# Patient Record
Sex: Male | Born: 1990 | Race: Black or African American | Hispanic: No | Smoking: Current every day smoker
Health system: Southern US, Community
[De-identification: ages and names within clinical notes are randomized; demographics above are authoritative.]

---

## 2013-08-12 ENCOUNTER — Emergency Department: Payer: Self-pay | Admitting: Emergency Medicine

## 2014-01-17 ENCOUNTER — Emergency Department: Payer: Self-pay | Admitting: Internal Medicine

## 2014-02-06 ENCOUNTER — Emergency Department: Payer: Self-pay | Admitting: Emergency Medicine

## 2014-10-24 DIAGNOSIS — M1990 Unspecified osteoarthritis, unspecified site: Secondary | ICD-10-CM

## 2014-10-24 HISTORY — DX: Unspecified osteoarthritis, unspecified site: M19.90

## 2015-03-27 ENCOUNTER — Emergency Department
Admission: EM | Admit: 2015-03-27 | Discharge: 2015-03-27 | Disposition: A | Discharge status: Home / Self Care | Payer: Self-pay | Attending: Emergency Medicine | Admitting: Emergency Medicine

## 2015-03-27 ENCOUNTER — Emergency Department: Payer: Self-pay

## 2015-03-27 DIAGNOSIS — Z720 Tobacco use: Secondary | ICD-10-CM | POA: Insufficient documentation

## 2015-03-27 DIAGNOSIS — M2141 Flat foot [pes planus] (acquired), right foot: Secondary | ICD-10-CM | POA: Insufficient documentation

## 2015-03-27 DIAGNOSIS — M1990 Unspecified osteoarthritis, unspecified site: Secondary | ICD-10-CM | POA: Insufficient documentation

## 2015-03-27 MED ORDER — IBUPROFEN 800 MG PO TABS
800.0000 mg | ORAL_TABLET | Freq: Once | ORAL | Status: AC
  Administered 2015-03-27: 800 mg via ORAL

## 2015-03-27 MED ORDER — TRAMADOL HCL 50 MG PO TABS: 50.0000 mg | ORAL_TABLET | Freq: Four times a day (QID) | ORAL | Status: DC | PRN

## 2015-03-27 MED ORDER — IBUPROFEN 800 MG PO TABS
ORAL_TABLET | ORAL | Status: AC
  Administered 2015-03-27: 800 mg via ORAL
  Filled 2015-03-27: qty 1

## 2015-03-27 MED ORDER — NAPROXEN 500 MG PO TABS: 500.0000 mg | ORAL_TABLET | Freq: Two times a day (BID) | ORAL | Status: AC

## 2015-03-27 NOTE — ED Notes (Signed)
Ace wrap applied to the right foot without diff, pt tol well, circulation within normal limits

## 2015-03-27 NOTE — ED Notes (Signed)
Pt c/o right foot pain for about a month-2 months.states he thinks it is from wearing steel toed shoes.Marland Kitchen.denies injury

## 2015-03-27 NOTE — Discharge Instructions (Signed)
Arthritis, Nonspecific °Arthritis is pain, redness, warmth, or puffiness (inflammation) of a joint. The joint may be stiff or hurt when you move it. One or more joints may be affected. There are many types of arthritis. Your doctor may not know what type you have right away. The most common cause of arthritis is wear and tear on the joint (osteoarthritis). °HOME CARE  °· Only take medicine as told by your doctor. °· Rest the joint as much as possible. °· Raise (elevate) your joint if it is puffy. °· Use crutches if the painful joint is in your leg. °· Drink enough fluids to keep your pee (urine) clear or pale yellow. °· Follow your doctor's diet instructions. °· Use cold packs for very bad joint pain for 10 to 15 minutes every hour. Ask your doctor if it is okay for you to use hot packs. °· Exercise as told by your doctor. °· Take a warm shower if you have stiffness in the morning. °· Move your sore joints throughout the day. °GET HELP RIGHT AWAY IF:  °· You have a fever. °· You have very bad joint pain, puffiness, or redness. °· You have many joints that are painful and puffy. °· You are not getting better with treatment. °· You have very bad back pain or leg weakness. °· You cannot control when you poop (bowel movement) or pee (urinate). °· You do not feel better in 24 hours or are getting worse. °· You are having side effects from your medicine. °MAKE SURE YOU:  °· Understand these instructions. °· Will watch your condition. °· Will get help right away if you are not doing well or get worse. °Document Released: 01/04/2010 Document Revised: 04/10/2012 Document Reviewed: 01/04/2010 °ExitCare® Patient Information ©2015 [employer]. This information is not intended to replace advice given to you by your health care provider. Make sure you discuss any questions you have with your health care provider. ° °

## 2015-03-27 NOTE — ED Provider Notes (Signed)
CSN: [id number]     Arrival date & time 03/27/15  1457 History   First MD Initiated Contact with Patient 03/27/15 1615     Chief Complaint  Patient presents with  . Foot Pain     (Consider location/radiation/quality/duration/timing/severity/associated sxs/prior Treatment) HPI Patient presents to the department with right foot pain states that he has had swelling off and on his right foot with pain for the last couple of months particular since he started a new job states that he wears steel toed boots all the time denies any trauma has not felt any pops cracks hasn't twisted or dropped anything on his foot states that he has flat feet and that is causing problem in the past rates his pain is worse about a 6 out of 10 nothing seemingly making it to clean better or worse denies any numbness tingling weakness in the extremity any trouble with weightbearing any swelling other than the area across the top of his foot and no other complaints at this time History reviewed. No pertinent past medical history. History reviewed. No pertinent past surgical history. No family history on file. History  Substance Use Topics  . Smoking status: Current Every Day Smoker -- 0.50 packs/day    Types: Cigarettes  . Smokeless tobacco: Never Used  . Alcohol Use: Yes    Review of Systems  Constitutional: Negative.   HENT: Negative.   Eyes: Negative.   Respiratory: Negative.   Cardiovascular: Negative.   Musculoskeletal: Negative.   Skin: Negative.   All other systems reviewed and are negative.      Allergies  Review of patient's allergies indicates no known allergies.  Home Medications   Prior to Admission medications   Medication Sig Start Date End Date Taking? Authorizing Provider  naproxen (NAPROSYN) 500 MG tablet Take 1 tablet (500 mg total) by mouth 2 (two) times daily with a meal. 03/27/15 03/26/16  Katlen Seyer Kristine GarbeWilliam C Zoanne Newill, PA-C  traMADol (ULTRAM) 50 MG tablet Take 1 tablet (50 mg total) by mouth  every 6 (six) hours as needed. 03/27/15   Manolito Jurewicz William C Marielis Samara, PA-C   BP 134/65 mmHg  Pulse 95  Temp(Src) 98.7 F (37.1 C) (Oral)  Resp 18  Ht 6\' 6"  (1.981 m)  Wt 350 lb (158.759 kg)  BMI 40.45 kg/m2  SpO2 96% Physical Exam Male appearing stated age well-developed well-nourished no acute distress Vitals nursing notes reviewed Head ears eyes nose neck and throat examination this patient was unremarkable Cardiovascular regular rate and rhythm no murmurs rubs gallops good distal pulses and cap refill Pulmonary lungs clear to auscultation bilaterally Musculoskeletal patient has noticeably flatfeet mild swelling the anterior ankle no palpable deformity or abnormality negative Homans sign Neuro exam nonfocal good distal sensation Skin appears free of rash or disease ED Course  Procedures (Ace wrap was applied to right foot Labs Review Labs Reviewed - No data to display  Imaging Review Dg Foot Complete Right  03/27/2015   CLINICAL DATA:  Right hindfoot and heel pain. Pes planus. No known injury.  EXAM: RIGHT FOOT COMPLETE - 3+ VIEW  COMPARISON:  None.  FINDINGS: There is no evidence of fracture or dislocation.  Mild degenerative spurring is seen involving the first MTP joint. Pes planus is noted, with mild to moderate osteoarthritis involving the talonavicular joint.  IMPRESSION:  No acute findings.  Pes planus and talonavicular osteoarthritis.  Mild degenerative spurring of first MTP joint.   Electronically Signed   By: Alver SorrowJohn  Stahl M.D.  On: 03/27/2015 16:55     EKG Interpretation None      MDM  Decision making on this patient she has had off and on foot pain for greater than the last 2 months worse when standing on it all day at work states that he wears flat steel toed boots he ordered he has flat feet denies any trauma or any other injury advised him that he should get some foot support follow-up with a podiatrist and start taking anti-inflammatory otherwise return here for any acute  concerns or worsening symptoms was given information for the podiatrist on call Final diagnoses:  Arthritis  Pes planus of right foot       Elainah Rhyne Rosalyn Gess, PA-C 03/27/15 1749  Minna Antis, MD 03/27/15 2318

## 2017-09-20 LAB — HM HIV SCREENING LAB: HM HIV Screening: NEGATIVE

## 2019-05-28 ENCOUNTER — Ambulatory Visit: Payer: Self-pay

## 2019-06-03 ENCOUNTER — Ambulatory Visit: Payer: Self-pay | Admitting: Physician Assistant

## 2019-06-03 ENCOUNTER — Other Ambulatory Visit: Payer: Self-pay

## 2019-06-03 ENCOUNTER — Encounter: Payer: Self-pay | Admitting: Physician Assistant

## 2019-06-03 DIAGNOSIS — Z113 Encounter for screening for infections with a predominantly sexual mode of transmission: Secondary | ICD-10-CM

## 2019-06-03 DIAGNOSIS — Z202 Contact with and (suspected) exposure to infections with a predominantly sexual mode of transmission: Secondary | ICD-10-CM

## 2019-06-03 LAB — GRAM STAIN

## 2019-06-03 MED ORDER — METRONIDAZOLE 500 MG PO TABS: 500.0000 mg | ORAL_TABLET | Freq: Once | ORAL | 0 refills | Status: AC

## 2019-06-03 NOTE — Progress Notes (Signed)
    STI clinic/screening visit  Subjective:  Frederick Delgado is a 28 y.o. male being seen today for an STI screening visit. The patient reports they do have symptoms.  Patient has the following medical conditions:  There are no active problems to display for this patient.    Chief Complaint  Patient presents with  . SEXUALLY TRANSMITTED DISEASE    HPI  Patient reports that he has had whitish discharge from his penis for about 1 week and that he is a contact to [place].  Denies other symptoms.  See flowsheet for further details and programmatic requirements.    The following portions of the patient's history were reviewed and updated as appropriate: allergies, current medications, past medical history, past social history, past surgical history and problem list.  Objective:  There were no vitals filed for this visit.  Physical Exam Constitutional:      General: He is not in acute distress.    Appearance: Normal appearance.  HENT:     Head: Normocephalic and atraumatic.     Mouth/Throat:     Mouth: Mucous membranes are moist.     Pharynx: Oropharynx is clear. No oropharyngeal exudate or posterior oropharyngeal erythema.  Neck:     Musculoskeletal: Neck supple.  Pulmonary:     Effort: Pulmonary effort is normal.  Abdominal:     Palpations: Abdomen is soft. There is no mass.     Tenderness: There is no abdominal tenderness. There is no guarding or rebound.  Genitourinary:    Penis: Normal.      Scrotum/Testes: Normal.     Comments: Pubic area without nits, lice, edema, erythema, lesions and inguinal adenopathy. Penis circumcised and without discharge at meatus. Lymphadenopathy:     Cervical: No cervical adenopathy.  Skin:    General: Skin is warm and dry.     Findings: No bruising, erythema, lesion or rash.  Neurological:     Mental Status: He is alert and oriented to person, place, and time.  Psychiatric:        Mood and Affect: Mood normal.        Behavior:  Behavior normal.        Thought Content: Thought content normal.        Judgment: Judgment normal.       Assessment and Plan:  Frederick Delgado is a 28 y.o. male presenting to the [facility] Department for STI screening  1. Screening for STD (sexually transmitted disease) Patient having symptoms and is a contact to [place]. Rec condoms with all sex Await test results.  Counseled that RN will call if needs to RTC for further treatment once results are back. - Gram stain - Gonococcus culture - HIV [facility] - Syphilis Serology, [facility] - Gonococcus culture  2. Venereal disease contact Will treat as a contact to [place] with Metronidazole 2 g po at one time with food, no EtOH for 24 hr before and until 72 hr after completing medicine. No sex for 7 days and until after partner completes treatment Rec RTC if vomits <2 hr after taking medicine.  - Gonococcus culture - HIV [facility] - Syphilis Serology, [facility] - Gonococcus culture - metroNIDAZOLE (FLAGYL) 500 MG tablet;  Take 4 tablets at one time by mouth for one dose.  Dispense: 4 tablet; Refill: 0     No follow-ups on file.  No future appointments.  Jerene Dilling, PA

## 2019-06-08 LAB — GONOCOCCUS CULTURE

## 2020-06-12 ENCOUNTER — Other Ambulatory Visit: Payer: Self-pay

## 2020-06-12 DIAGNOSIS — Z20822 Contact with and (suspected) exposure to covid-19: Secondary | ICD-10-CM

## 2020-06-13 LAB — NOVEL CORONAVIRUS, NAA: SARS-CoV-2, NAA: DETECTED — AB

## 2020-06-13 LAB — SARS-COV-2, NAA 2 DAY TAT

## 2020-06-14 ENCOUNTER — Telehealth: Payer: Self-pay | Admitting: Nurse Practitioner

## 2020-06-14 NOTE — Telephone Encounter (Signed)
Called to discuss with Frederick Delgado about Covid symptoms and the use of casirivimab/imdevimab, a combination monoclonal antibody infusion for those with mild to moderate Covid symptoms and at a high risk of hospitalization.     Pt does not qualify for infusion therapy as he  has asymptomatic infection. Isolation precautions discussed. Advised to contact back for consideration should he develop symptoms. Patient verbalized understanding.     Frederick Delgado, AGPCNP-BC

## 2021-11-26 ENCOUNTER — Other Ambulatory Visit: Payer: Self-pay

## 2021-11-26 ENCOUNTER — Encounter: Payer: Self-pay | Admitting: Family Medicine

## 2021-11-26 ENCOUNTER — Ambulatory Visit (INDEPENDENT_AMBULATORY_CARE_PROVIDER_SITE_OTHER): Payer: BC Managed Care – PPO | Admitting: Family Medicine

## 2021-11-26 ENCOUNTER — Telehealth: Payer: Self-pay | Admitting: Family Medicine

## 2021-11-26 VITALS — BP 132/78 | HR 61 | Ht 77.0 in | Wt 391.0 lb

## 2021-11-26 DIAGNOSIS — M7661 Achilles tendinitis, right leg: Secondary | ICD-10-CM

## 2021-11-26 DIAGNOSIS — M2141 Flat foot [pes planus] (acquired), right foot: Secondary | ICD-10-CM | POA: Diagnosis not present

## 2021-11-26 DIAGNOSIS — M19071 Primary osteoarthritis, right ankle and foot: Secondary | ICD-10-CM

## 2021-11-26 DIAGNOSIS — M2140 Flat foot [pes planus] (acquired), unspecified foot: Secondary | ICD-10-CM | POA: Insufficient documentation

## 2021-11-26 DIAGNOSIS — M76821 Posterior tibial tendinitis, right leg: Secondary | ICD-10-CM

## 2021-11-26 DIAGNOSIS — M2142 Flat foot [pes planus] (acquired), left foot: Secondary | ICD-10-CM

## 2021-11-26 MED ORDER — DICLOFENAC SODIUM 75 MG PO TBEC: 75.0000 mg | DELAYED_RELEASE_TABLET | Freq: Two times a day (BID) | ORAL | 0 refills | Status: DC | PRN

## 2021-11-26 NOTE — Assessment & Plan Note (Signed)
31 year old gentleman presents for evaluation of chronic right foot and ankle pain, atraumatic in onset, aggravated for years with prolonged weightbearing, tight fitting shoes, and has begun to interfere with his occupation (works at [employer]).  Pain is somewhat diffuse in nature about what he describes as the midfoot and hindfoot, no radiation proximally or distally, associated with swelling, stiffness after period of immobility, and describes weakness, no report of paresthesias.  Treatment today included OTC medications with sporadic benefit, record review reveals prior visit to ER in 2016 during which naproxen and tramadol written for, patient is uncertain about benefit.  Examination reveals severe pes planus bilaterally, generalized swelling about the hindfoot, he has limited range of motion in all directions when compared to contralateral, tenderness about the medial aspect of the insertional Achilles, medial midfoot arch, and along the posterior tibialis tendons.  Patient has reduced capacity to perform single-leg heel raise on right, perform this without issue on the left.  Given patient's x-ray findings showing focal areas of degenerative changes that are consistent with his severe pes planus, pain localizing to the medial midfoot arch, posterior tibialis tendon, and Achilles, I have reviewed both surgical and nonsurgical treatment strategies.  He is wishing to proceed in a nonsurgical manner at this stage, as such I have advised the benefit of custom molded orthotics, initial home-based rehab with [facility] foot/ankle conditioning program materials provided today, and course of diclofenac until his return.  I ordered new x-rays to assess interval degeneration from prior studies available from 2016, this can serve as preprocedural planning if local injections are indicated for focal recalcitrant pain at his return.  Chronic issue, uncontrolled, radiology review, Rx management

## 2021-11-26 NOTE — Telephone Encounter (Signed)
Please advise for podiatry referral.

## 2021-11-26 NOTE — Progress Notes (Signed)
Primary Care / Sports Medicine Office Visit  Patient Information:  Patient ID: Frederick Delgado, male DOB: 1991-04-20 Age: 31 y.o. MRN: [id number]   Frederick Delgado is a pleasant 31 y.o. male presenting with the following:  Chief Complaint  Patient presents with   New Patient (Initial Visit)   Establish Care   Foot Pain    Right heel and lateral side; x10 years; no imaging or evaluation for it since 2016; treatments include stretching, ibuprofen, Tylenol, naproxen, Tramadol, heat, ice with no relief; 10/10 pain    Vitals:   11/26/21 1033  BP: 132/78  Pulse: 61  SpO2: 98%   Vitals:   11/26/21 1033  Weight: (!) 391 lb (177.4 kg)  Height: 6\' 5"  (1.956 m)   Body mass index is 46.37 kg/m.  No results found.   Independent interpretation of notes and tests performed by another provider:   None  Procedures performed:   Independent interpretation of right foot and ankle x-rays from 2016 reveal severe pes planus, focal degenerative changes at the talar malleolar junction, medial greater than lateral, joint space loss and osteophyte formation at the talonavicular junction best appreciated on lateral view, first MTP degenerative changes.  Pertinent History, Exam, Impression, and Recommendations:   Flat foot 31 year old gentleman presents for evaluation of chronic right foot and ankle pain, atraumatic in onset, aggravated for years with prolonged weightbearing, tight fitting shoes, and has begun to interfere with his occupation (works at [employer]).  Pain is somewhat diffuse in nature about what he describes as the midfoot and hindfoot, no radiation proximally or distally, associated with swelling, stiffness after period of immobility, and describes weakness, no report of paresthesias.  Treatment today included OTC medications with sporadic benefit, record review reveals prior visit to ER in 2016 during which naproxen and tramadol written for, patient is uncertain about  benefit.  Examination reveals severe pes planus bilaterally, generalized swelling about the hindfoot, he has limited range of motion in all directions when compared to contralateral, tenderness about the medial aspect of the insertional Achilles, medial midfoot arch, and along the posterior tibialis tendons.  Patient has reduced capacity to perform single-leg heel raise on right, perform this without issue on the left.  Given patient's x-ray findings showing focal areas of degenerative changes that are consistent with his severe pes planus, pain localizing to the medial midfoot arch, posterior tibialis tendon, and Achilles, I have reviewed both surgical and nonsurgical treatment strategies.  He is wishing to proceed in a nonsurgical manner at this stage, as such I have advised the benefit of custom molded orthotics, initial home-based rehab with [facility] foot/ankle conditioning program materials provided today, and course of diclofenac until his return.  I ordered new x-rays to assess interval degeneration from prior studies available from 2016, this can serve as preprocedural planning if local injections are indicated for focal recalcitrant pain at his return.  Chronic issue, uncontrolled, radiology review, Rx management  Posterior tibial tendinitis, right See additional assessment(s) for plan details.  Achilles tendinitis, right leg See additional assessment(s) for plan details.  Localized osteoarthritis of right ankle See additional assessment(s) for plan details.   Orders & Medications Meds ordered this encounter  Medications   diclofenac (VOLTAREN) 75 MG EC tablet    Sig: Take 1 tablet (75 mg total) by mouth 2 (two) times daily as needed (foot/ankle pain).    Dispense:  60 tablet    Refill:  0   Orders Placed This Encounter  Procedures   DG Foot Complete Right   DG Ankle Complete Right   Ambulatory referral to Sports Medicine     Return in about 6 weeks (around 01/07/2022).      Jerrol Banana, MD   Primary Care Sports Medicine [facility]

## 2021-11-26 NOTE — Patient Instructions (Signed)
-   Start diclofenac twice daily x2 weeks (take with food) - After 2 weeks dose up to twice daily on an as-needed basis for foot/ankle pain - Start home exercises after 1 week - Referral coordinator will contact you to schedule custom orthotic inserts - Return for follow-up in 6 weeks, contact for questions between now and then

## 2021-11-26 NOTE — Assessment & Plan Note (Signed)
See additional assessment(s) for plan details. 

## 2021-11-26 NOTE — Telephone Encounter (Signed)
Pts is calling to request a referral to be placed to the foot center in [place] for a second opinion. Please advise when the referral is placed CB- [phone]

## 2021-11-26 NOTE — Telephone Encounter (Signed)
Podiatry referral placed.  Left message notifying patient.

## 2021-12-01 ENCOUNTER — Ambulatory Visit: Payer: BC Managed Care – PPO | Admitting: Family Medicine

## 2021-12-10 ENCOUNTER — Other Ambulatory Visit: Payer: Self-pay

## 2021-12-10 ENCOUNTER — Ambulatory Visit (INDEPENDENT_AMBULATORY_CARE_PROVIDER_SITE_OTHER): Payer: BC Managed Care – PPO

## 2021-12-10 ENCOUNTER — Encounter: Payer: Self-pay | Admitting: Podiatry

## 2021-12-10 ENCOUNTER — Ambulatory Visit: Payer: BC Managed Care – PPO | Admitting: Podiatry

## 2021-12-10 DIAGNOSIS — M2141 Flat foot [pes planus] (acquired), right foot: Secondary | ICD-10-CM

## 2021-12-10 DIAGNOSIS — M19071 Primary osteoarthritis, right ankle and foot: Secondary | ICD-10-CM

## 2021-12-10 DIAGNOSIS — M2140 Flat foot [pes planus] (acquired), unspecified foot: Secondary | ICD-10-CM

## 2021-12-13 ENCOUNTER — Telehealth: Payer: Self-pay | Admitting: Podiatry

## 2021-12-13 NOTE — Telephone Encounter (Signed)
Frederick Delgado has a return to work date of 01/10/2022, is that ok?

## 2021-12-14 ENCOUNTER — Telehealth: Payer: Self-pay | Admitting: Family Medicine

## 2021-12-14 NOTE — Telephone Encounter (Signed)
Pt called in for assistance. Pt says that he is working with Loletta Parish and need the office notes to his visits. Pt would like to have them faxed to    [phone] Claims # [id number]  Please assist pt further.

## 2021-12-15 NOTE — Telephone Encounter (Signed)
Office visit notes faxed as requested.  Patient is following with podiatry for his medical condition.

## 2021-12-16 ENCOUNTER — Telehealth: Payer: Self-pay | Admitting: Podiatry

## 2021-12-16 NOTE — Telephone Encounter (Signed)
Good Morning, Frederick Delgado is calling again because he needs his notes for his job. I told patient I will give him a call as soon as they are dictated. "Frederick Delgado stated, "if you dont mind me asking what is the hold up on him doing my notes". I told him we are going through a transition with another Physician that is no longer with the Practice, and that you had to absorb some of those pts. But, I told Frederick Delgado I would definitely call him back once I see them in the system.Marland Kitchen

## 2021-12-17 NOTE — Telephone Encounter (Signed)
Note dictated. He will likely be out of work longer than 01/10/22, but I guess we can extend it when he comes back into the office in a month. Thanks Marylene Land!!! - Dr. Bea Laura

## 2021-12-17 NOTE — Progress Notes (Signed)
Subjective:  31 y.o. male presenting today as a new patient to the practice.  Patient was last seen in 2016 for the same condition of right foot pain when x-rays were taken.  Patient states that for several years now he has had pain and tenderness associated to his right foot.  This is been an ongoing issue for over 10 years now.  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  He states that for several years he feels as if he is walking on a constant bruise.  He experiences right foot pain all over the foot.  Worse in the mornings or when standing.  He says he has very flat feet.  He denies a history of injury.  He has tried resting his foot, stretching, ice, heat, and foot soaks, and orthotics with no improvement.  He presents for further treatment and evaluation  Past Medical History:  Diagnosis Date   Arthritis 2016   right foot   No past surgical history on file. No Known Allergies     Objective/Physical Exam General: The patient is alert and oriented x3 in no acute distress.  Dermatology: Skin is warm, dry and supple bilateral lower extremities. Negative for open lesions or macerations.  Vascular: Palpable pedal pulses bilaterally. No edema or erythema noted. Capillary refill within normal limits.  Neurological: Epicritic and protective threshold grossly intact bilaterally.   Musculoskeletal Exam: Range of motion within normal limits to all pedal and ankle joints bilateral. Muscle strength 5/5 in all groups bilateral.  Upon weightbearing there is a medial longitudinal arch collapse bilaterally. Remove foot valgus noted to the bilateral lower extremities with excessive pronation upon mid stance.  Radiographic Exam:  Normal osseous mineralization.  Advanced, severe degenerative changes noted throughout the midtarsal joint especially the TN, and subtalar joint.  Periarticular spurring and dorsal beaking noted on lateral view. Pes planus noted on radiographic  exam lateral views. Decreased calcaneal inclination and metatarsal declination angle is noted.  Assessment: 1. pes planus bilateral 2.  Advanced severe DJD right midfoot/rearfoot  Plan of Care:  1. Patient was evaluated. X-Rays reviewed.  2.  Today we discussed the patient's condition including advanced arthritic changes throughout the midfoot.  We discussed both conservative and surgical management.  The patient is very frustrated with the foot and is at the point where he would like to pursue aggressive surgical management.  Triple arthrodesis surgery was discussed in detail with the patient including the postoperative recovery.  No guarantees were expressed or implied.  Although the patient is ready to proceed with surgery, I do recommend that he goes home and talks with his family and friends about the surgery and postoperative recovery course. 3.  Also recommend MRI right foot.  Order placed 4.  Unfortunately the patient has been out of work for several months associated to his foot condition.  When he has surgery he will likely be out for an additional several months for postoperative recovery.  I do believe after discussion with the patient that surgery is warranted at this point due to the advanced degenerative changes and failure of conservative treatment to provide any sort of lasting alleviation of symptoms for the patient.   5.  Return to clinic in 1 month to review MRI scan results and for surgical consult.   Felecia Shelling, DPM [facility]  Dr. Felecia Shelling, DPM    [address]  [place], [place] [address]                Office [phone]  Fax [phone]

## 2021-12-27 ENCOUNTER — Ambulatory Visit
Admission: RE | Admit: 2021-12-27 | Discharge: 2021-12-27 | Disposition: A | Discharge status: Home / Self Care | Payer: BC Managed Care – PPO | Source: Ambulatory Visit | Attending: Podiatry | Admitting: Podiatry

## 2021-12-27 DIAGNOSIS — M19071 Primary osteoarthritis, right ankle and foot: Secondary | ICD-10-CM

## 2021-12-27 DIAGNOSIS — M2140 Flat foot [pes planus] (acquired), unspecified foot: Secondary | ICD-10-CM

## 2022-01-07 ENCOUNTER — Ambulatory Visit: Payer: Self-pay | Admitting: Family Medicine

## 2022-01-11 ENCOUNTER — Ambulatory Visit: Payer: BC Managed Care – PPO | Admitting: Podiatry

## 2022-01-11 ENCOUNTER — Other Ambulatory Visit: Payer: Self-pay

## 2022-01-11 DIAGNOSIS — M19071 Primary osteoarthritis, right ankle and foot: Secondary | ICD-10-CM

## 2022-01-11 DIAGNOSIS — M2140 Flat foot [pes planus] (acquired), unspecified foot: Secondary | ICD-10-CM | POA: Diagnosis not present

## 2022-01-11 NOTE — Progress Notes (Signed)
? ?  Subjective:  ?31 y.o. male presenting today as a new patient to the practice.  Patient was last seen in 2016 for the same condition of right foot pain when x-rays were taken.  Patient states that for several years now he has had pain and tenderness associated to his right foot.  This is been an ongoing issue for over 10 years now.  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  He states that for several years he feels as if he is walking on a constant bruise.  He experiences right foot pain all over the foot.  Worse in the mornings or when standing.  He says he has very flat feet.  He denies a history of injury.  He has tried resting his foot, stretching, ice, heat, and foot soaks, and orthotics with no improvement.  He presents for further treatment and evaluation ? ?Past Medical History:  ?Diagnosis Date  ? Arthritis 2016  ? right foot  ? ?No past surgical history on file. ?No Known Allergies ? ? ?  ?Objective/Physical Exam ?General: The patient is alert and oriented x3 in no acute distress. ? ?Dermatology: Skin is warm, dry and supple bilateral lower extremities. Negative for open lesions or macerations. ? ?Vascular: Palpable pedal pulses bilaterally. No edema or erythema noted. Capillary refill within normal limits. ? ?Neurological: Epicritic and protective threshold grossly intact bilaterally.  ? ?Musculoskeletal Exam: Range of motion within normal limits to all pedal and ankle joints bilateral. Muscle strength 5/5 in all groups bilateral.  ?Upon weightbearing there is a medial longitudinal arch collapse bilaterally. Remove foot valgus noted to the bilateral lower extremities with excessive pronation upon mid stance. ? ?Radiographic Exam:  ?Normal osseous mineralization.  Advanced, severe degenerative changes noted throughout the midtarsal joint especially the TN, and subtalar joint.  Periarticular spurring and dorsal beaking noted on lateral view. ?Pes planus noted on radiographic  exam lateral views. Decreased calcaneal inclination and metatarsal declination angle is noted. ? ?Assessment: ?1. pes planus bilateral ?2.  Advanced severe DJD right midfoot/rearfoot ? ?Plan of Care:  ?1. Patient was evaluated.  The MRI performed to the right foot was reviewed today and unfortunately only shows the forefoot.  We will need to contact [facility] imaging and possibly need to redo the MRI to cover the rear foot ?2.  Also the patient states that he has not tried custom orthotics before in the past.  Today were going to set up an appointment with the Pedorthist for custom molded orthotics to see if this helps alleviate his pain prior to surgery ?3.  Return to clinic after 4-6 weeks of wearing the orthotics to see if this helps alleviate some of his pain or symptoms ? ? ?Edrick Kins, DPM ?[place] ? ?Dr. Edrick Kins, DPM  ?  ?[address]                                        ?[place], [place] [address]                ?Office [phone]  ?Fax [phone] ? ? ? ? ?

## 2022-01-12 ENCOUNTER — Telehealth: Payer: Self-pay

## 2022-01-12 NOTE — Telephone Encounter (Signed)
-----   Message from Everitt Amber sent at 01/12/2022 12:20 PM EDT ----- ?Regarding: FW: Needs visit ?Please schedule for [facility]/ PCP Annual physical appt ?----- Message ----- ?From: Jerrol Banana, MD ?Sent: 01/11/2022   6:09 PM EDT ?To: [facility] ?Subject: Needs visit                                   ? ?Patient needs primary care visit for annual physical scheduled ? ? ?

## 2022-01-14 ENCOUNTER — Other Ambulatory Visit: Payer: BC Managed Care – PPO

## 2022-01-17 ENCOUNTER — Encounter: Payer: BC Managed Care – PPO | Admitting: Family Medicine

## 2022-01-25 ENCOUNTER — Encounter: Payer: Self-pay | Admitting: Family Medicine

## 2022-01-25 ENCOUNTER — Ambulatory Visit (INDEPENDENT_AMBULATORY_CARE_PROVIDER_SITE_OTHER): Payer: BC Managed Care – PPO | Admitting: Family Medicine

## 2022-01-25 VITALS — BP 122/78 | HR 78 | Ht 77.0 in | Wt >= 6400 oz

## 2022-01-25 DIAGNOSIS — Z13220 Encounter for screening for lipoid disorders: Secondary | ICD-10-CM | POA: Diagnosis not present

## 2022-01-25 DIAGNOSIS — M2141 Flat foot [pes planus] (acquired), right foot: Secondary | ICD-10-CM

## 2022-01-25 DIAGNOSIS — Z1159 Encounter for screening for other viral diseases: Secondary | ICD-10-CM | POA: Diagnosis not present

## 2022-01-25 DIAGNOSIS — Z6841 Body Mass Index (BMI) 40.0 and over, adult: Secondary | ICD-10-CM

## 2022-01-25 DIAGNOSIS — Z0000 Encounter for general adult medical examination without abnormal findings: Secondary | ICD-10-CM | POA: Diagnosis not present

## 2022-01-25 DIAGNOSIS — R7989 Other specified abnormal findings of blood chemistry: Secondary | ICD-10-CM

## 2022-01-25 DIAGNOSIS — Z114 Encounter for screening for human immunodeficiency virus [HIV]: Secondary | ICD-10-CM

## 2022-01-25 DIAGNOSIS — M2142 Flat foot [pes planus] (acquired), left foot: Secondary | ICD-10-CM

## 2022-01-25 NOTE — Assessment & Plan Note (Signed)
Annual examination completed, risk stratification labs ordered, anticipatory guidance provided.  We will follow labs once resulted. 

## 2022-01-25 NOTE — Assessment & Plan Note (Signed)
We reviewed lifestyle modification methods, generating a net caloric deficit, and surgical options given BMI.  Information provided today in that regard, risk stratification labs ordered, he can contact us if wishing to proceed with bariatric surgery. ?

## 2022-01-25 NOTE — Progress Notes (Signed)
?  ? ?Annual Physical Exam Visit ? ?Patient Information:  ?Patient ID: Frederick Delgado, male DOB: [birth year] Age: 31 y.o. MRN: [id number]  ? ?Subjective:  ? ?CC: Annual Physical Exam ? ?HPI:  ?Frederick Delgado is here for their annual physical. ? ?I reviewed the past medical history, family history, social history, surgical history, and allergies today and changes were made as necessary.  Please see the problem list section below for additional details. ? ?Past Medical History: ?Past Medical History:  ?Diagnosis Date  ? Arthritis 2016  ? right foot  ? ?Past Surgical History: ?History reviewed. No pertinent surgical history. ?Family History: ?Family History  ?Problem Relation Age of Onset  ? Diabetes Mother   ? Stroke Father   ? Heart attack Father   ? Diabetes Brother   ? Stroke Maternal Grandmother   ? Cancer Maternal Grandfather   ? Cancer Paternal Grandfather   ? ?Allergies: ?No Known Allergies ?Health Maintenance: ?Health Maintenance  ?Topic Date Due  ? Hepatitis C Screening  Never done  ? TETANUS/TDAP  01/26/2023 (Originally 03/22/2008)  ? INFLUENZA VACCINE  05/24/2022  ? HIV Screening  Completed  ? HPV VACCINES  Aged Out  ? COVID-19 Vaccine  Discontinued  ?  ?HM Colonoscopy   ? ? This patient has no relevant Health Maintenance data.  ? ?  ? ?Medications: ?No current outpatient medications on file prior to visit.  ? ?No current facility-administered medications on file prior to visit.  ? ? ?Review of Systems: No headache, visual changes, nausea, vomiting, diarrhea, constipation, dizziness, abdominal pain, skin rash, fevers, chills, night sweats, swollen lymph nodes, weight loss, chest pain, body aches, joint swelling, muscle aches, shortness of breath, mood changes, visual or auditory hallucinations reported. ? ?Objective:  ? ?Vitals:  ? 01/25/22 1109  ?BP: 122/78  ?Pulse: 78  ?SpO2: 98%  ? ?Vitals:  ? 01/25/22 1109  ?Weight: (!) 404 lb (183.3 kg)  ?Height: 6\' 5"  (1.956 m)  ? ?Body mass index is 47.91  kg/m?. ? ?General: Well Developed, obese, well nourished, and in no acute distress.  ?Neuro: Alert and oriented x3, extra-ocular muscles intact, sensation grossly intact. Cranial nerves II through XII are grossly intact, motor, sensory, and coordinative functions are intact. ?HEENT: Normocephalic, atraumatic, pupils equal round reactive to light, neck supple, no masses, no lymphadenopathy, thyroid nonpalpable. Oropharynx, nasopharynx, external ear canals are unremarkable. ?Skin: Warm and dry, no rashes noted.  ?Cardiac: Regular rate and rhythm, no murmurs rubs or gallops. No peripheral edema. Pulses symmetric. ?Respiratory: Clear to auscultation bilaterally. Not using accessory muscles, speaking in full sentences.  ?Abdominal: Soft, nontender, nondistended, positive bowel sounds, no masses, no organomegaly. ?Musculoskeletal: Shoulder, elbow, wrist, hip, knee, ankle stable, and with full range of motion. ? ?Impression and Recommendations:  ? ?The patient was counselled, risk factors were discussed, and anticipatory guidance given. ? ?Flat foot ?Patient has established with podiatry who is reviewing surgical options with the patient, work-up ongoing.  We will follow peripherally on this issue. ? ?Morbid obesity with BMI of 45.0-49.9, adult ([place]) ?We reviewed lifestyle modification methods, generating a net caloric deficit, and surgical options given BMI.  Information provided today in that regard, risk stratification labs ordered, he can contact us if wishing to proceed with bariatric surgery. ? ?Annual physical exam ?Annual examination completed, risk stratification labs ordered, anticipatory guidance provided.  We will follow labs once resulted. ? ?Orders & Medications ?Medications: No orders of the defined types were placed in this encounter. ? ?Orders  Placed This Encounter  ?Procedures  ? Apo A1 + B + Ratio  ? CBC  ? Comprehensive metabolic panel  ? Hepatitis C antibody  ? VITAMIN D 25 Hydroxy (Vit-D Deficiency,  Fractures)  ? TSH  ? Lipid panel  ? HIV Antibody (routine testing w rflx)  ?  ? ?Return in about 1 year (around 01/26/2023) for Annual Physical.  ? ? ?Montel Culver, MD ? ? Primary Care Sports Medicine ?[facility] ?[place]  ? ?

## 2022-01-25 NOTE — Patient Instructions (Signed)
-   Obtain fasting labs with orders provided (can have water or black coffee but otherwise no food or drink x 8 hours before labs) °- Review information provided °- Attend eye doctor annually, dentist every 6 months, work towards or maintain 30 minutes of moderate intensity physical activity at least 5 days per week, and consume a balanced diet °- Return in 1 year for physical °- Contact us for any questions between now and then °

## 2022-01-25 NOTE — Assessment & Plan Note (Signed)
Patient has established with podiatry who is reviewing surgical options with the patient, work-up ongoing.  We will follow peripherally on this issue. ?

## 2022-02-07 ENCOUNTER — Telehealth: Payer: Self-pay | Admitting: Podiatry

## 2022-02-07 NOTE — Telephone Encounter (Signed)
Frederick Delgado was wondering if anyone had reached out to [facility] about the MRI that was done. He said that you were suppose to have it set up again because It didn't show the part of his foot that you wanted. He also didn't want to be charged 2 times for the MRI. Please contact patient with update. ?

## 2022-02-11 NOTE — Telephone Encounter (Signed)
Repeat MRI scheduled for 02/15/22. Will contact the patient after MRI to review results and discuss further surgical planning.  Thanks, Dr. Amalia Hailey

## 2022-02-15 ENCOUNTER — Ambulatory Visit: Payer: BC Managed Care – PPO | Admitting: Podiatry

## 2022-02-18 ENCOUNTER — Other Ambulatory Visit: Payer: Self-pay | Admitting: Podiatry

## 2022-02-18 ENCOUNTER — Ambulatory Visit
Admission: RE | Admit: 2022-02-18 | Discharge: 2022-02-18 | Disposition: A | Discharge status: Home / Self Care | Payer: BC Managed Care – PPO | Source: Ambulatory Visit | Attending: Podiatry | Admitting: Podiatry

## 2022-02-18 ENCOUNTER — Telehealth: Payer: Self-pay | Admitting: Podiatry

## 2022-02-18 DIAGNOSIS — M19071 Primary osteoarthritis, right ankle and foot: Secondary | ICD-10-CM

## 2022-02-18 NOTE — Telephone Encounter (Signed)
Sharl Ma with [facility] called and stated that they had an order for the foot and not the heel. They brought the pt back in for additional images to include the heel. The radiologist will not read it unless there is an order for the ankle. Please enter order for MR Right ankle in system and they will proceed as a no charge and have them read. Sharl Ma direct number [phone] ?

## 2022-02-28 NOTE — Progress Notes (Signed)
Could we please call this patient and set up a follow-up appointment next week or 2?  Thanks, Dr. Logan Bores

## 2022-02-28 NOTE — Progress Notes (Signed)
Unless the patient has not received his custom orthotics yet.  I really want to see him about 4 weeks after he has been wearing the custom orthotics.  Thanks, Dr. Logan Bores

## 2022-03-01 ENCOUNTER — Ambulatory Visit: Payer: BC Managed Care – PPO | Admitting: Podiatry

## 2022-03-01 ENCOUNTER — Encounter: Payer: Self-pay | Admitting: Podiatry

## 2022-03-01 DIAGNOSIS — M21861 Other specified acquired deformities of right lower leg: Secondary | ICD-10-CM

## 2022-03-01 DIAGNOSIS — M2140 Flat foot [pes planus] (acquired), unspecified foot: Secondary | ICD-10-CM

## 2022-03-01 NOTE — Progress Notes (Signed)
? ?Subjective:  ?31 y.o. 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  Patient recently had updated ankle MRI on 02/10/2022 and he presents today to review the results and discuss surgery.   ? ?Patient was initially evaluated 2016 for the same condition of right foot pain when x-rays were taken.  Patient states that for several years now he has had pain and tenderness associated to his right foot.  This is been an ongoing issue for over 10+ years now.  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  He states that for several years he feels as if he is walking on a constant bruise.  He experiences right foot pain all over the foot.  Worse in the mornings or when standing.  He says he has very flat feet.  He denies a history of injury.  He has tried resting his foot, stretching, ice, heat, and foot soaks, and orthotics with no improvement.  He presents for further treatment and evaluation ? ?Past Medical History:  ?Diagnosis Date  ? Arthritis 2016  ? right foot  ? ?No past surgical history on file. ?No Known Allergies ? ? ?  ?Objective/Physical Exam ?General: The patient is alert and oriented x3 in no acute distress. ? ?Dermatology: Skin is warm, dry and supple bilateral lower extremities. Negative for open lesions or macerations. ? ?Vascular: Palpable pedal pulses bilaterally. No edema or erythema noted. Capillary refill within normal limits. ? ?Neurological: Epicritic and protective threshold grossly intact bilaterally.  ? ?Musculoskeletal Exam: Range of motion within normal limits to all pedal and ankle joints bilateral. Muscle strength 5/5 in all groups bilateral.  ?Upon weightbearing there is a medial longitudinal arch collapse bilaterally. Remove foot valgus noted to the bilateral lower extremities with excessive pronation upon mid stance. Limited ankle joint dorsiflexion noted with the leg extended. Findings  consistent with Gastroc Equinus.  ? ?Radiographic Exam RT foot 12/10/2021:  ?Normal osseous mineralization.  Advanced, severe degenerative changes noted throughout the midtarsal joint especially the TN, and subtalar joint.  Periarticular spurring and dorsal beaking noted on lateral view. ?Pes planus noted on radiographic exam lateral views. Decreased calcaneal inclination and metatarsal declination angle is noted. ? ?MR FOOT RT WO CONTRAST 12/27/2021: ?IMPRESSION: ?1. No acute osseous abnormality of the right forefoot. ?2. Mild intramuscular edema within the abductor hallucis, flexor ?hallucis brevis, and flexor digitorum brevis muscles within the ?forefoot. Findings are nonspecific and could reflect mild muscle ?strains. ?3. Mild osteoarthritis of the first MTP joint. ?4. Small amount of fluid within the first, second, and third ?intermetatarsal spaces, which may reflect mild bursitis. ? ?MR ANKLE RT WO CONTRAST 02/18/2022: ?Bones: Pes planus deformity is seen. Joint space narrowing and ?osteophytosis are present at the talonavicular joint. There is ?lateral angulation of the calcaneus with a hindfoot valgus angle of ?33 degrees. A tiny focus of edema is seen in the lateral process of ?talus. Marrow signal in the distal fibula and the adjacent calcaneus ?is normal. ?Other: None. ?  ?IMPRESSION: ?Chronic ATFL tear. ?Pes planus and hindfoot valgus deformity. ?Talonavicular osteoarthritis. ?Very mild Achilles tendinosis without tear. ? ?Assessment: ?1. pes planus bilateral ?2.  Advanced severe DJD right midfoot/rearfoot ?2.  Equinus deformity RLE ? ?Plan of Care:  ?1. Patient was evaluated.  MRIs reviewed.  ?2.  Unfortunately the patient has failed multiple conservative modalities and MRIs demonstrate onset of degenerative changes to the foot  and arthritis.  The patient is anxious to have surgery to help alleviate his symptoms.  The patient opts for surgical management. All possible complications and details of the  procedure were explained. All patient questions were answered. No guarantees were expressed or implied.  Unfortunately the patient is young to be candidate for triple arthrodesis but I do believe that extra-articular joint preserving flatfoot recon surgery would not provide lasting alleviation of symptoms for the patient.  This was discussed with the patient and he agrees and understands. ?3. Authorization for surgery was initiated today. Surgery will consist of triple arthrodesis right.  Tendo Achilles lengthening right. ?4.  Return to clinic 1 week postop ? ? ?Felecia Shelling, DPM ?[facility] ? ?Dr. Felecia Shelling, DPM  ?  ?[address]                                        ?[place], [place] [address]                ?Office [phone]  ?Fax [phone] ? ? ? ? ?

## 2022-03-02 ENCOUNTER — Telehealth: Payer: Self-pay | Admitting: Urology

## 2022-03-02 ENCOUNTER — Telehealth: Payer: Self-pay

## 2022-03-02 NOTE — Telephone Encounter (Signed)
Dr. Logan Bores ordered a knee scooter for Frederick Delgado's surgery on 03/10/2022. Per [employer] he does not qualify for a knee scooter. He is over the height and weight limit. I notified Dr. Logan Bores and he said Elber will be fine with crutches. ?

## 2022-03-02 NOTE — Telephone Encounter (Signed)
DOS - 03/10/22 ? ?TENDO ACHILLES LENGTH RIGHT --- [phone] ?TRIPLE ARTHRODESIS RIGHT --- [phone] ? ?[employer] EFFECTIVE DATE - 10/24/21 ? ?PLAN DEDUCTIBLE - $700.00 W/ $0.00 REMAINING ?OUT OF POCKET - $4,500.00 W/ $3,683.00 REMAINING ?COINSURANCE - 0% ?COPAY - $0.00 ? ? ?SPOKE WITH TISH WITH [employer] AND SHE STATED THAT FOR CPT CODES 94765 AND [phone] NO PRIOR AUTH IS REQUIRED. ? ?REF # [id number] ? ?

## 2022-03-03 NOTE — Telephone Encounter (Signed)
error 

## 2022-03-10 ENCOUNTER — Other Ambulatory Visit: Payer: Self-pay | Admitting: Podiatry

## 2022-03-10 ENCOUNTER — Encounter: Payer: Self-pay | Admitting: Podiatry

## 2022-03-10 DIAGNOSIS — M21861 Other specified acquired deformities of right lower leg: Secondary | ICD-10-CM | POA: Diagnosis not present

## 2022-03-10 DIAGNOSIS — M2141 Flat foot [pes planus] (acquired), right foot: Secondary | ICD-10-CM | POA: Diagnosis not present

## 2022-03-10 MED ORDER — DICLOFENAC SODIUM 75 MG PO TBEC: 75.0000 mg | DELAYED_RELEASE_TABLET | Freq: Two times a day (BID) | ORAL | 1 refills | Status: DC

## 2022-03-10 MED ORDER — OXYCODONE-ACETAMINOPHEN 10-325 MG PO TABS: 1.0000 | ORAL_TABLET | ORAL | 0 refills | Status: DC | PRN

## 2022-03-10 NOTE — Progress Notes (Signed)
PRN postop 

## 2022-03-16 ENCOUNTER — Encounter: Payer: Self-pay | Admitting: Podiatry

## 2022-03-18 ENCOUNTER — Ambulatory Visit (INDEPENDENT_AMBULATORY_CARE_PROVIDER_SITE_OTHER): Payer: BC Managed Care – PPO | Admitting: Podiatry

## 2022-03-18 ENCOUNTER — Ambulatory Visit (INDEPENDENT_AMBULATORY_CARE_PROVIDER_SITE_OTHER): Payer: BC Managed Care – PPO

## 2022-03-18 DIAGNOSIS — Z9889 Other specified postprocedural states: Secondary | ICD-10-CM

## 2022-03-18 MED ORDER — OXYCODONE-ACETAMINOPHEN 10-325 MG PO TABS: 1.0000 | ORAL_TABLET | Freq: Three times a day (TID) | ORAL | 0 refills | Status: DC | PRN

## 2022-03-18 NOTE — Progress Notes (Signed)
   Subjective:  Patient presents today status post triple arthrodesis right foot. DOS: 03/10/2022.  Patient states that he is doing well.  Pain is tolerable.  He has kept the dressings clean dry and intact and has been nonweightbearing.  Denies fever chills nausea vomiting shortness of breath or chest pain.  Presenting POV #1  Past Medical History:  Diagnosis Date   Arthritis 2016   right foot      Objective/Physical Exam Neurovascular status intact.  Skin incisions appear to be well coapted with sutures and staples intact. No sign of infectious process noted. No dehiscence. No active bleeding noted.  JP drain intact.  No active bleeding.  Moderate edema noted to the surgical extremity.  Radiographic Exam:  Orthopedic hardware and arthrodesis sites appear to be stable with routine healing.  Assessment: 1. s/p triple arthrodesis with tendo Achilles lengthening right. DOS: 03/10/2022   Plan of Care:  1. Patient was evaluated. X-rays reviewed 2.  JP drain removed 3.  Dry sterile dressings applied.  Clean dry and intact x1 week 4.  Continue strict nonweightbearing in the cam boot with the assistance of the knee scooter 5.  Refill prescription for Percocet 10/325 mg every 8 hours as needed pain #21 6.  Return to clinic 1 week   Felecia Shelling, DPM [facility]  Dr. Felecia Shelling, DPM    [address] [place], [place] [address]                Office [phone]  Fax [phone]

## 2022-03-25 ENCOUNTER — Ambulatory Visit (INDEPENDENT_AMBULATORY_CARE_PROVIDER_SITE_OTHER): Payer: BC Managed Care – PPO | Admitting: Podiatry

## 2022-03-25 DIAGNOSIS — Z9889 Other specified postprocedural states: Secondary | ICD-10-CM

## 2022-04-01 ENCOUNTER — Other Ambulatory Visit: Payer: Self-pay | Admitting: Podiatry

## 2022-04-04 MED ORDER — OXYCODONE-ACETAMINOPHEN 10-325 MG PO TABS: 1.0000 | ORAL_TABLET | Freq: Three times a day (TID) | ORAL | 0 refills | Status: DC | PRN

## 2022-04-04 NOTE — Telephone Encounter (Signed)
Please advise 

## 2022-04-05 NOTE — Progress Notes (Signed)
   Subjective:  Patient presents today status post triple arthrodesis right foot. DOS: 03/10/2022.  Patient continues to do well.  He says the pain is very tolerable.  It has improved over the past week.  No new complaints at this time  Past Medical History:  Diagnosis Date   Arthritis 2016   right foot   No past surgical history on file. No Known Allergies   Objective/Physical Exam Neurovascular status intact.  Skin incisions appear to be well coapted with sutures and staples intact. No sign of infectious process noted. No dehiscence. No active bleeding noted.  No active bleeding.  Moderate edema noted to the surgical extremity.  Radiographic Exam RT foot 03/18/2022:  Orthopedic hardware and arthrodesis sites appear to be stable with routine healing.  Assessment: 1. s/p triple arthrodesis with tendo Achilles lengthening right. DOS: 03/10/2022   Plan of Care:  1. Patient was evaluated.  Continue strict nonweightbearing surgical extremity 2.  Overall the patient is doing much better.  He has noticed a significant reduction of pain postoperatively 3.  Dressings were changed today.  Keep clean dry and intact x1 week 4.  Return to clinic in 1 week for staple removal   Felecia Shelling, DPM [facility]  Dr. Felecia Shelling, DPM    [address] [place], [place] [address]                Office [phone]  Fax [phone]

## 2022-04-08 ENCOUNTER — Ambulatory Visit (INDEPENDENT_AMBULATORY_CARE_PROVIDER_SITE_OTHER): Payer: BC Managed Care – PPO | Admitting: Podiatry

## 2022-04-08 DIAGNOSIS — Z9889 Other specified postprocedural states: Secondary | ICD-10-CM

## 2022-04-08 MED ORDER — OXYCODONE-ACETAMINOPHEN 5-325 MG PO TABS: 1.0000 | ORAL_TABLET | Freq: Three times a day (TID) | ORAL | 0 refills | Status: DC | PRN

## 2022-04-08 NOTE — Progress Notes (Signed)
   Subjective:  Patient presents today status post triple arthrodesis right foot. DOS: 03/10/2022.  Patient continues to do well.  He says the pain is very tolerable.  It has improved over the past week.  No new complaints at this time.  Presenting to have the sutures and staples removed  Past Medical History:  Diagnosis Date   Arthritis 2016   right foot   No past surgical history on file. No Known Allergies   Objective/Physical Exam Neurovascular status intact.  Skin incisions appear to be well coapted with sutures and staples intact. No sign of infectious process noted. No dehiscence. No active bleeding noted.  No active bleeding.  There continues to be some mild edema noted to the surgical extremity.  Radiographic Exam RT foot 03/18/2022:  Orthopedic hardware and arthrodesis sites appear to be stable with routine healing.  Assessment: 1. s/p triple arthrodesis with tendo Achilles lengthening right. DOS: 03/10/2022   Plan of Care:  1. Patient was evaluated.   2.  Sutures and staples removed 3.  Continue strict nonweightbearing surgical extremity with the knee scooter wearing the cam boot 4.  Recommend Ace wrap daily and antibiotic ointment over the freshly healed incision sites 5.  Return to clinic in 4 weeks for follow-up x-ray   Felecia Shelling, DPM [facility]  Dr. Felecia Shelling, DPM    [address] [place], [place] [address]                Office [phone]  Fax [phone]

## 2022-05-06 ENCOUNTER — Ambulatory Visit (INDEPENDENT_AMBULATORY_CARE_PROVIDER_SITE_OTHER): Payer: BC Managed Care – PPO | Admitting: Podiatry

## 2022-05-06 ENCOUNTER — Ambulatory Visit (INDEPENDENT_AMBULATORY_CARE_PROVIDER_SITE_OTHER): Payer: BC Managed Care – PPO

## 2022-05-06 DIAGNOSIS — Z9889 Other specified postprocedural states: Secondary | ICD-10-CM

## 2022-05-06 MED ORDER — GENTAMICIN SULFATE 0.1 % EX CREA: 1.0000 | TOPICAL_CREAM | Freq: Two times a day (BID) | CUTANEOUS | 1 refills | Status: DC

## 2022-05-06 MED ORDER — DOXYCYCLINE HYCLATE 100 MG PO TABS: 100.0000 mg | ORAL_TABLET | Freq: Two times a day (BID) | ORAL | 0 refills | Status: DC

## 2022-05-07 LAB — WOUND CULTURE

## 2022-05-08 LAB — WOUND CULTURE

## 2022-05-10 LAB — WOUND CULTURE

## 2022-05-19 NOTE — Progress Notes (Signed)
   Chief Complaint  Patient presents with   Post-op Follow-up    Post op visit    Subjective:  Patient presents today status post triple arthrodesis right foot. DOS: 03/10/2022.  Patient continues to do well.  He has been nonweightbearing in the cam boot.  No new complaints at this time  Past Medical History:  Diagnosis Date   Arthritis 2016   right foot   No past surgical history on file. No Known Allergies    Objective/Physical Exam Neurovascular status intact.  The lateral incision site does demonstrate some dehiscence and ulcer development.  Please see above noted photo.  There is no exposed hardware or bone.  Mixed fibrotic and granular tissue.  No malodor.  Periwound is intact.  Radiographic Exam RT foot 05/06/2022:  Orthopedic hardware and arthrodesis sites appear to be stable with routine healing.  Overall radiographically good healing and stability of the surgical sites  Assessment: 1. s/p triple arthrodesis with tendo Achilles lengthening right. DOS: 03/10/2022   Plan of Care:  1. Patient was evaluated.   2.  Patient may begin minimal weightbearing in the cam boot 3.  Prescription for gentamicin cream apply 2 times daily to the wound 4.  Prescription for doxycycline 100 mg 2 times daily #20 5.  Cultures were also taken and sent to pathology for culture and sensitivity 6.  Return to clinic 4 weeks for follow-up x-ray and wound reevaluation  Felecia Shelling, DPM [facility]  Dr. Felecia Shelling, DPM    [address] [place], [place] [address]                Office [phone]  Fax [phone]

## 2022-06-10 ENCOUNTER — Ambulatory Visit (INDEPENDENT_AMBULATORY_CARE_PROVIDER_SITE_OTHER): Payer: BC Managed Care – PPO

## 2022-06-10 ENCOUNTER — Ambulatory Visit (INDEPENDENT_AMBULATORY_CARE_PROVIDER_SITE_OTHER): Payer: BC Managed Care – PPO | Admitting: Podiatry

## 2022-06-10 DIAGNOSIS — Z9889 Other specified postprocedural states: Secondary | ICD-10-CM | POA: Diagnosis not present

## 2022-06-10 NOTE — Progress Notes (Signed)
   Chief Complaint  Patient presents with   Post-op Follow-up    Post op follow-up.    Subjective:  Patient presents today status post triple arthrodesis right foot. DOS: 03/10/2022.  Patient states that he is doing well.  He is applying the antibiotic gentamicin cream and a light dressing to the lateral incision site.  He believes it is healed.  He has also been weightbearing in the cam boot.  He is now 3 months postop.  Past Medical History:  Diagnosis Date   Arthritis 2016   right foot   No past surgical history on file. No Known Allergies    Objective/Physical Exam Neurovascular status intact.  Lateral incision site mostly healed with exception of a small focal area along the distal incision.  Overall significant improvement since last visit. No edema or erythema noted to the foot.  No pain with palpation either.  Radiographic Exam RT foot 06/10/2022:  No change since last x-rays taken.  Orthopedic hardware and arthrodesis sites appear to be stable with routine healing.  Overall radiographically good healing and stability of the surgical sites  Assessment: 1. s/p triple arthrodesis with tendo Achilles lengthening right. DOS: 03/10/2022   Plan of Care:  1. Patient was evaluated.   2.  Continue gentamicin cream daily to the lateral wound 3.  Continue weightbearing in the cam boot. 4.  With the patient is being very sedentary around the house he may transition out of the cam boot into good supportive sneakers and tennis shoes 5.  Return to clinic 8 weeks for follow-up x-ray  Felecia Shelling, DPM [facility]  Dr. Felecia Shelling, DPM    [address] [place], [place] [address]                Office [phone]  Fax [phone]

## 2022-07-08 ENCOUNTER — Encounter: Payer: Self-pay | Admitting: Podiatry

## 2022-07-11 ENCOUNTER — Telehealth: Payer: Self-pay | Admitting: Podiatry

## 2022-07-11 NOTE — Telephone Encounter (Signed)
Frederick Delgado called to see if he could be referred to Physical Therapy.

## 2022-07-11 NOTE — Telephone Encounter (Signed)
I sent Frederick Delgado a secure chat message at lunchtime today.  I will have a prescription for him available at the front desk in [place] to [facility].  Please notify patient.  Thanks, Dr. Amalia Hailey

## 2022-07-12 NOTE — Telephone Encounter (Signed)
Spoke with patient and he verbalized understanding of the instructions per Dr. Amalia Hailey and he will pick the rx up from [place] location.

## 2022-08-05 ENCOUNTER — Ambulatory Visit: Payer: Self-pay | Admitting: Podiatry

## 2022-08-15 ENCOUNTER — Telehealth: Payer: Self-pay | Admitting: Podiatry

## 2022-08-15 NOTE — Telephone Encounter (Signed)
I was in need of a return to work update for patient.

## 2022-08-16 NOTE — Telephone Encounter (Signed)
I was supposed to see him 2 weeks ago.  I guess the appointment was canceled. I was planning to send him back to work at this appointment 2 weeks ago. Maybe if we could call him and have him come in for final x-rays and evaluations. Larene [place] could you do this? Thanks, Dr. Amalia Hailey

## 2022-12-20 ENCOUNTER — Ambulatory Visit: Payer: 59 | Admitting: Podiatry

## 2022-12-20 ENCOUNTER — Ambulatory Visit (INDEPENDENT_AMBULATORY_CARE_PROVIDER_SITE_OTHER): Payer: BC Managed Care – PPO

## 2022-12-20 ENCOUNTER — Encounter: Payer: Self-pay | Admitting: Podiatry

## 2022-12-20 ENCOUNTER — Telehealth: Payer: Self-pay | Admitting: Podiatry

## 2022-12-20 VITALS — BP 169/87 | HR 77

## 2022-12-20 DIAGNOSIS — Z9889 Other specified postprocedural states: Secondary | ICD-10-CM | POA: Diagnosis not present

## 2022-12-20 NOTE — Patient Instructions (Signed)
[facility] [address] in [place]

## 2022-12-20 NOTE — Progress Notes (Signed)
   No chief complaint on file.   Subjective:  Patient presents today status post triple arthrodesis right foot. DOS: 03/10/2022.  Overall the patient is doing well.  He does feel as if he is walking more on the lateral column of the foot and his great toes not purchasing the ground.  He has been wearing regular shoes.  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 No Known Allergies   Objective/Physical Exam Neurovascular status intact.  Incisions nicely healed.  There is no edema to the foot.  Stability of the rear foot noted.  There is no tenderness with palpation throughout the foot or ankle.  With forefoot loading there does appear to be a forefoot varus type deformity with the first ray more elevated than the fifth ray.  Radiographic Exam RT foot 12/20/2022:  Stable arthrodesis.  Orthopedic hardware and arthrodesis sites appear to be stable with routine healing.  Overall radiographically good healing and stability of the surgical sites  Assessment: 1. s/p triple arthrodesis with tendo Achilles lengthening right. DOS: 03/10/2022 2.  Forefoot varus type deformity right foot -I do believe the patient would benefit from custom molded orthotics to help support the surgical foot and equally distribute pressure during ambulation.  Unfortunately the orthotics are unaffordable to the patient at the moment -In the meantime recommend good supportive shoes with OTC prefabricated arch supports.  Recommended [facility] [employer] in [place] -From a surgical standpoint the patient is okay to resume full activity no restrictions Return to clinic as needed  Edrick Kins, DPM [facility]  Dr. Edrick Kins, DPM    [address], [place] [address]                Office [phone]  Fax [phone]

## 2022-12-20 NOTE — Telephone Encounter (Signed)
Pt was wanting a referral for PT  Please advise

## 2022-12-26 ENCOUNTER — Other Ambulatory Visit: Payer: Self-pay | Admitting: Podiatry

## 2022-12-26 DIAGNOSIS — Z9889 Other specified postprocedural states: Secondary | ICD-10-CM

## 2022-12-26 DIAGNOSIS — M62461 Contracture of muscle, right lower leg: Secondary | ICD-10-CM

## 2022-12-26 DIAGNOSIS — M2140 Flat foot [pes planus] (acquired), unspecified foot: Secondary | ICD-10-CM

## 2022-12-26 NOTE — Telephone Encounter (Signed)
Order placed for physical therapy at [facility] outpatient rehab and physical medicine.  They should contact him to set up an appointment.  Please notify patient.  Thanks, Dr. Amalia Hailey

## 2022-12-28 ENCOUNTER — Telehealth: Payer: Self-pay | Admitting: Podiatry

## 2022-12-28 NOTE — Telephone Encounter (Signed)
Pts wife called stating they called a week ago asking about a referral to physical therapy and they were told they would get a call but have not yet.   Upon checking chart there was a referral sent to [facility] pys, sports medicine and I gave pts wife number to call them to get scheduled. She also req appt with Dr Amalia Hailey and it is scheduled for 4.5.2024

## 2023-01-02 ENCOUNTER — Telehealth: Payer: Self-pay | Admitting: Podiatry

## 2023-01-02 NOTE — Telephone Encounter (Signed)
I am a little confused as to what to do with Mr. Frederick Delgado as far as disability is concerned. Last I heard, we both tried to reach him on 08/15/2022, you were going to release him to return to work on 06/10/2022. So we couldn't reach the patient, I spoke with his disability company, they couldn't reach the patient. So I was told that they were gonna close the case. Mr. Frederick Delgado made a appointment and was seen on 12/20/2022 and he wondering why paperwork was not sent to his disability. I told him that I sent over his last notes on 06/10/2022 and he hasn't been seen since then, nor was he able to be contacted. Mr. Frederick Delgado said he was having a problem with his phone. Please advise?

## 2023-01-02 NOTE — Telephone Encounter (Signed)
From my standpoint I am a little confused as well.  When I saw him on 12/20/2022 I basically signed off stating he was full activity no restrictions.  He then called back into the office requesting physical therapy, which usually I am never opposed to if the patient would like to pursue that.  So from my standpoint he is cleared to return to work based on last visit 12/20/2022.-Dr. Amalia Hailey

## 2023-01-04 ENCOUNTER — Encounter: Payer: Self-pay | Admitting: Family Medicine

## 2023-01-04 ENCOUNTER — Ambulatory Visit (INDEPENDENT_AMBULATORY_CARE_PROVIDER_SITE_OTHER): Payer: 59 | Admitting: Family Medicine

## 2023-01-04 VITALS — BP 128/78 | HR 75 | Ht 77.0 in | Wt >= 6400 oz

## 2023-01-04 DIAGNOSIS — M2141 Flat foot [pes planus] (acquired), right foot: Secondary | ICD-10-CM | POA: Diagnosis not present

## 2023-01-04 DIAGNOSIS — M7661 Achilles tendinitis, right leg: Secondary | ICD-10-CM | POA: Diagnosis not present

## 2023-01-04 DIAGNOSIS — M19071 Primary osteoarthritis, right ankle and foot: Secondary | ICD-10-CM | POA: Diagnosis not present

## 2023-01-04 DIAGNOSIS — M2142 Flat foot [pes planus] (acquired), left foot: Secondary | ICD-10-CM

## 2023-01-04 NOTE — Assessment & Plan Note (Signed)
Patient presents with concerns over recent right foot/ankle surgery secondary to pes planus, Achilles tendinopathy, and talonavicular osteoarthritis.  He describes concerns over gait involving weight primarily traversing through the lateral column of his right foot with insufficient contact of his forefoot.  EMR notes reviewed including recent visit with his podiatrist, Dr. Daylene Katayama, who advised custom orthoses which patient was unable to proceed due to financial barriers.  Alternate OTC options offered and, from discussion with patient, physical therapy.  Patient has yet to begin physical therapy and obtaining OTC orthotics, wanted to check in with [place] first.  He is interested in a second opinion due to concern over him returning to work given his persistent symptoms.  In addition to placing referral, I did advise patient to pursue recommendations by his podiatrist over the interim.  Questions were answered, patient will follow-up as per routine for annual physical exam.  He was advised to contact her office for any questions over the interim.

## 2023-01-04 NOTE — Patient Instructions (Signed)
-   Start physical therapy with referral through your podiatrist - Recommend obtaining "off-the-shelf" shoe inserts (orthotics) - Referral coordinator will contact you to schedule podiatry visit - Return for annual physical as scheduled - Reach out for any questions between now and then

## 2023-01-04 NOTE — Progress Notes (Signed)
     Primary Care / Sports Medicine Office Visit  Patient Information:  Patient ID: Frederick Delgado, male DOB: 05/28/1991 Age: 32 y.o. MRN: [id number]   Frederick Delgado is a pleasant 32 y.o. male presenting with the following:  Chief Complaint  Patient presents with   Referral    2nd opinion     Vitals:   01/04/23 0826  BP: 128/78  Pulse: 75  SpO2: 98%   Vitals:   01/04/23 0826  Weight: (!) 407 lb (184.6 kg)  Height: 6\' 5"  (1.956 m)   Body mass index is 48.26 kg/m.  DG Foot Complete Right  Result Date: 12/20/2022 Please see detailed radiograph report in office note.    Independent interpretation of notes and tests performed by another provider:   None  Procedures performed:   None  Pertinent History, Exam, Impression, and Recommendations:   Frederick Delgado was seen today for referral.  Pes planus of both feet Assessment & Plan: Patient presents with concerns over recent right foot/ankle surgery secondary to pes planus, Achilles tendinopathy, and talonavicular osteoarthritis.  He describes concerns over gait involving weight primarily traversing through the lateral column of his right foot with insufficient contact of his forefoot.  EMR notes reviewed including recent visit with his podiatrist, Dr. Daylene Katayama, who advised custom orthoses which patient was unable to proceed due to financial barriers.  Alternate OTC options offered and, from discussion with patient, physical therapy.  Patient has yet to begin physical therapy and obtaining OTC orthotics, wanted to check in with [place] first.  He is interested in a second opinion due to concern over him returning to work given his persistent symptoms.  In addition to placing referral, I did advise patient to pursue recommendations by his podiatrist over the interim.  Questions were answered, patient will follow-up as per routine for annual physical exam.  He was advised to contact her office for any questions over the  interim.  Orders: -     Ambulatory referral to Podiatry  Localized osteoarthritis of right ankle -     Ambulatory referral to Podiatry  Achilles tendinitis, right leg -     Ambulatory referral to Podiatry   I provided a total time of 41 minutes including both face-to-face and non-face-to-face time on 01/04/2023 inclusive of time utilized for medical chart review, information gathering, care coordination with staff, and documentation completion.   Orders & Medications No orders of the defined types were placed in this encounter.  Orders Placed This Encounter  Procedures   Ambulatory referral to Podiatry     Return in about 2 months (around 03/06/2023) for CPE.     Montel Culver, MD, [facility]   Primary Care Sports Medicine Primary Care and Sports Medicine at [facility]

## 2023-01-05 NOTE — Telephone Encounter (Signed)
Patient updated thru voice message. 

## 2023-01-06 ENCOUNTER — Ambulatory Visit: Payer: 59 | Admitting: Podiatry

## 2023-01-18 ENCOUNTER — Ambulatory Visit: Payer: 59 | Attending: Podiatry | Admitting: Physical Therapy

## 2023-01-24 ENCOUNTER — Ambulatory Visit: Payer: 59 | Admitting: Physical Therapy

## 2023-01-24 ENCOUNTER — Ambulatory Visit: Payer: 59 | Attending: Podiatry | Admitting: Physical Therapy

## 2023-01-24 ENCOUNTER — Encounter: Payer: Self-pay | Admitting: Physical Therapy

## 2023-01-24 DIAGNOSIS — Z9889 Other specified postprocedural states: Secondary | ICD-10-CM | POA: Diagnosis not present

## 2023-01-24 DIAGNOSIS — M2140 Flat foot [pes planus] (acquired), unspecified foot: Secondary | ICD-10-CM | POA: Insufficient documentation

## 2023-01-24 DIAGNOSIS — M25571 Pain in right ankle and joints of right foot: Secondary | ICD-10-CM | POA: Insufficient documentation

## 2023-01-24 DIAGNOSIS — M62461 Contracture of muscle, right lower leg: Secondary | ICD-10-CM | POA: Insufficient documentation

## 2023-01-24 NOTE — Therapy (Incomplete)
OUTPATIENT PHYSICAL THERAPY LOWER EXTREMITY EVALUATION   Patient Name: Frederick Delgado MRN: [id number] DOB:[birth year], 32 y.o., male Today's Date: 01/26/2023  END OF SESSION:  PT End of Session - 01/26/23 1001     Visit Number 1    Number of Visits 17    Date for PT Re-Evaluation 04/14/23    Authorization - Visit Number 1    Authorization - Number of Visits 10    Progress Note Due on Visit 10    PT Start Time [id number]    PT Stop Time 1430    PT Time Calculation (min) 45 min    Activity Tolerance Patient tolerated treatment well    Behavior During Therapy [facility] for tasks assessed/performed             Past Medical History:  Diagnosis Date   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 No pertinent surgical history. Patient Active Problem List   Diagnosis Date Noted   Morbid obesity with BMI of 45.0-49.9, adult 01/25/2022   Annual physical exam 01/25/2022   Flat foot 11/26/2021   Localized osteoarthritis of right ankle 11/26/2021   Achilles tendinitis, right leg 11/26/2021   Posterior tibial tendinitis, right 11/26/2021    PCP: Rosette Reveal   REFERRING PROVIDER: Zigmund Daniel MD  REFERRING DIAG: Danford Bad  THERAPY DIAG:  Pain in right ankle and joints of right foot  Rationale for Evaluation and Treatment: Rehabilitation  ONSET DATE: May 2023  SUBJECTIVE:   SUBJECTIVE STATEMENT: triple arthrodesis of the right foot 03/10/22.  PERTINENT HISTORY: Pt is a 32 year old triple arthrodesis of the right foot 03/10/22. Pt reports post surgery he thought he was doing really well. Over the past couple months he has realizes he is having more difficulty than he realized. He reports he tried to help a friend move a couple months ago and reports it really bothered his foot. He realizes he feels pain every time he pushes off his toes while walking after he has been walking for 83mins, lifting, stair negotiation. Pt is trying to walk for exercise at least 41min most days of the  week. Pain is a stiff/tight pain that he feels at the 1st methead to the inside of his foot. Current pain 3/10; best 1/10; worst 9/10. Feels like he is not weight bearing through his "whole foot"- "like I'm walking on the outside of my foot". Pt is not working currently, previously enjoyed playing basketball and playing pokemon go. Currently reports being unable to complete those activities, spending most of his time at home playing video games.   PAIN:  Are you having pain? Yes: NPRS scale: 3/10 Pain location: 1st methead to the inside of his foot Pain description: stiff/tight Aggravating factors:  every time he pushes off his toes while walking after he has been walking for 3mins, lifting, stair negotiation Relieving factors: foot massage  PRECAUTIONS: None  WEIGHT BEARING RESTRICTIONS: No  FALLS:  Has patient fallen in last 6 months? No  LIVING ENVIRONMENT: Lives with: lives with their partner Lives in: House/apartment Stairs: No Has following equipment at home: None  OCCUPATION: unemployed  PLOF: Independent  PATIENT GOALS: be able to walk and run normally without pain  NEXT MD VISIT: not not scheduled  OBJECTIVE:   DIAGNOSTIC FINDINGS:  Radiographic Exam RT foot 12/20/2022:  Stable arthrodesis.  Orthopedic hardware and arthrodesis sites appear to be stable with routine healing.  Overall radiographically good healing and stability of the surgical sites  PATIENT  SURVEYS:  FOTO next visit  COGNITION: Overall cognitive status: Within functional limits for tasks assessed     SENSATION: WFL   POSTURE: decreased lumbar lordosis and weight shift left Navicular drop bilat; midfoot pronation bilat R>L, rests with R foot in eversion   PALPATION: TTP with concordant pain to palpation of GT met head Tension to intrinsic foot muscles  LOWER EXTREMITY ROM:  Active/PROM Right eval Left eval  Hip flexion    Hip extension 10d 10d  Hip abduction    Hip adduction    Hip  internal rotation    Hip external rotation    Knee flexion    Knee extension    Ankle dorsiflexion 7d/10d 18d PROM = AROM  Ankle plantarflexion 30d/35d 39d PROM = AROM  Ankle inversion 0d 10d PROM = AROM  Ankle eversion 31d 56d PROM = AROM   GT PROM: R: 32d L 81  (Blank rows = not tested)  LOWER EXTREMITY MMT:  MMT Right eval Left eval  Hip flexion 5 5  Hip extension 4 4+  Hip abduction 4 4+  Hip adduction    Hip internal rotation 5 5  Hip external rotation 5 5  Knee flexion 5 5  Knee extension 5 5  Ankle dorsiflexion 4 (within avail range) 5  Ankle plantarflexion    Ankle inversion 5 (within avail range) 5  Ankle eversion 5(within avail range) 5  Single Leg bridge  to failure R: 13 L 16 SL Heel raise to failure R: unable L21 (Blank rows = not tested)  LOWER EXTREMITY SPECIAL TESTS:  Ankle special tests: Navicular Drop: positive R foot  FUNCTIONAL TESTS:  5 times sit to stand: next visit 10 meter walk test: SS: 0.6m/sec fastest: 1.38m/sec SLS R 1sec L 13sec  Squat: ankle eversion exaggerated more in lowering phase, bilat midfoot supination in lowering  GAIT: Distance walked: 54m Assistive device utilized: None Level of assistance: Complete Independence Comments: decreased toe push off R foot, ankle eversion with R swing phase with correction to allow tfor heel strike- gait abnormalities more visualized with "fastest: gait   TODAY'S TREATMENT:                                                                                                                              DATE: 01/24/23 PT reviewed the following HEP with patient with patient able to demonstrate a set of the following with min cuing for correction needed. PT educated patient on parameters of therex (how/when to inc/decrease intensity, frequency, rep/set range, stretch hold time, and purpose of therex) with verbalized understanding.   Access Code: [email or web address] - Seated Self Great Toe Stretch  - 2-3 x daily - 7  x weekly - 60sec hold - Big toe mobilization  - 2-3 x daily - 7 x weekly - 12-20 reps - 2-3sec hold - Standing Single Leg Stance with Counter Support  - 2-3 x daily - 7 x weekly - 60sec hold  PATIENT EDUCATION:  Education details: Patient was educated on diagnosis, anatomy and pathology involved, prognosis, role of PT, and was given an HEP, demonstrating exercise with proper form following verbal and tactile cues, and was given a paper hand out to continue exercise at home. Pt was educated on and agreed to plan of care.  Person educated: Patient Education method: Explanation, Demonstration, Tactile cues, Verbal cues, and Handouts Education comprehension: verbalized understanding and returned demonstration  HOME EXERCISE PROGRAM: [id number]  ASSESSMENT:  CLINICAL IMPRESSION: Patient is a 32 y.o. male who was seen today for physical therapy evaluation and treatment for R foot/ankle pain; has been present since triple arthrodesis May 2023. Impairments in decreased great toe ext mobility, decreased ankle mobility, decreased RLE and core strength, static balance abnormal posture, decreased motor control of functional movements, and gait deviations. Activity limitations in stair negotiation, squatting, lifting, community ambulation, standing; inhibiting participation in self care and community ADLs. Would benefit from skilled PT to address above deficits and promote optimal return to PLOF.   OBJECTIVE IMPAIRMENTS: Abnormal gait, decreased activity tolerance, decreased balance, decreased coordination, decreased mobility, difficulty walking, decreased ROM, decreased strength, hypomobility, impaired sensation, improper body mechanics, postural dysfunction, obesity, and pain.   ACTIVITY LIMITATIONS: carrying, lifting, bending, standing, squatting, stairs, transfers, bathing, and locomotion level  PARTICIPATION LIMITATIONS: meal prep, cleaning, shopping, community activity, and yard work  PERSONAL  FACTORS: Fitness, Past/current experiences, Profession, Time since onset of injury/illness/exacerbation, and 1-2 comorbidities: BMI, OA, pes planus  are also affecting patient's functional outcome.   REHAB POTENTIAL: Good  CLINICAL DECISION MAKING: Evolving/moderate complexity  EVALUATION COMPLEXITY: Moderate   GOALS: Goals reviewed with patient? Yes  SHORT TERM GOALS: Target date: 03/03/23 Pt will be independent with HEP in order to improve strength and balance in order to decrease fall risk and improve function at home and work.  Baseline: HEP given  Goal status: INITIAL    LONG TERM GOALS: Target date: 04/13/23  Patient will increase FOTO score to 79 to demonstrate predicted increase in functional mobility to complete ADLs Baseline: 01/23/33 Goal status: INITIAL  2.  Pt will decrease worst pain as reported on NPRS by at least 3 points in order to demonstrate clinically significant reduction in pain.  Baseline:  Goal status: INITIAL  3.  Pt will demonstrate at least 50d of R great toe ext to be able to provide reasonable increase in mobility needed to improve gait mechanics and reduce pain Baseline: 01/24/23 32d Goal status: INITIAL  4.  Pt will demonstrate SLS time of 45sec bilat in order to demonstrate age matched static balance of improvement on ankle stability needed for ankle strategy Baseline: 01/24/23 R 1sec L 13sec Goal status: INITIAL  5.  Pt will demonstrate at least 14 R SL bridges to failure in order to demonstrate 90% hip ext strength of unaffected side to complete heavy ADLs without compensation Baseline: 01/24/23 R 13 L 17 Goal status: INITIAL  6.  Pt will demonstrate at least 18 SL heel raises on RLE (in available range) to demonstrate 90% endurance of unaffected side in order to ambulate in community without fatigue or compensation Baseline: R: unable L 21reps Goal status: INITIAL   PLAN:  PT FREQUENCY: 1-2x/week  PT DURATION: 8 weeks  PLANNED  INTERVENTIONS: Therapeutic exercises, Therapeutic activity, Neuromuscular re-education, Balance training, Gait training, Patient/Family education, Self Care, Joint mobilization, Joint manipulation, Stair training, DME instructions, Dry Needling, Electrical stimulation, Spinal manipulation, Spinal mobilization, Cryotherapy, Moist heat, Traction, Manual therapy, and Re-evaluation  PLAN FOR NEXT SESSION: 5xSTS; HEP review, FOTO  Durwin Reges DPT Durwin Reges, PT 01/26/2023, 10:55 AM

## 2023-01-26 ENCOUNTER — Encounter: Payer: Self-pay | Admitting: Physical Therapy

## 2023-01-26 ENCOUNTER — Ambulatory Visit: Payer: 59 | Admitting: Physical Therapy

## 2023-01-26 DIAGNOSIS — Z9889 Other specified postprocedural states: Secondary | ICD-10-CM | POA: Diagnosis not present

## 2023-01-26 DIAGNOSIS — M25571 Pain in right ankle and joints of right foot: Secondary | ICD-10-CM

## 2023-01-26 DIAGNOSIS — M2140 Flat foot [pes planus] (acquired), unspecified foot: Secondary | ICD-10-CM | POA: Diagnosis not present

## 2023-01-26 DIAGNOSIS — M62461 Contracture of muscle, right lower leg: Secondary | ICD-10-CM | POA: Diagnosis not present

## 2023-01-26 NOTE — Therapy (Signed)
OUTPATIENT PHYSICAL THERAPY LOWER EXTREMITY EVALUATION   Patient Name: Frederick Delgado MRN: [id number] DOB:April 27, 1991, 32 y.o., male Today's Date: 01/26/2023  END OF SESSION:  PT End of Session - 01/26/23 1124     Visit Number 2    Number of Visits 17    Date for PT Re-Evaluation 04/14/23    Authorization - Visit Number 2    Authorization - Number of Visits 10    Progress Note Due on Visit 10    PT Start Time [id number]    PT Stop Time 1203    PT Time Calculation (min) 38 min    Activity Tolerance Patient tolerated treatment well    Behavior During Therapy [facility] for tasks assessed/performed              Past Medical History:  Diagnosis Date   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 No pertinent surgical history. Patient Active Problem List   Diagnosis Date Noted   Morbid obesity with BMI of 45.0-49.9, adult 01/25/2022   Annual physical exam 01/25/2022   Flat foot 11/26/2021   Localized osteoarthritis of right ankle 11/26/2021   Achilles tendinitis, right leg 11/26/2021   Posterior tibial tendinitis, right 11/26/2021    PCP: Rosette Reveal   REFERRING PROVIDER: Zigmund Daniel MD  REFERRING DIAG: Danford Bad  THERAPY DIAG:  Pain in right ankle and joints of right foot  Rationale for Evaluation and Treatment: Rehabilitation  ONSET DATE: May 2023  SUBJECTIVE:   SUBJECTIVE STATEMENT: Pt reports he has been completing his HEP and he has a little soreness from this but is feeling less pain overall. Reports 2/10 pain this am.   PERTINENT HISTORY: Pt is a 32 year old triple arthrodesis of the right foot 03/10/22. Pt reports post surgery he thought he was doing really well. Over the past couple months he has realizes he is having more difficulty than he realized. He reports he tried to help a friend move a couple months ago and reports it really bothered his foot. He realizes he feels pain every time he pushes off his toes while walking after he has been walking for  64mins, lifting, stair negotiation. Pt is trying to walk for exercise at least 28min most days of the week. Pain is a stiff/tight pain that he feels at the 1st methead to the inside of his foot. Current pain 3/10; best 1/10; worst 9/10. Feels like he is not weight bearing through his "whole foot"- "like I'm walking on the outside of my foot". Pt is not working currently, previously enjoyed playing basketball and playing pokemon go. Currently reports being unable to complete those activities, spending most of his time at home playing video games.   PAIN:  Are you having pain? Yes: NPRS scale: 3/10 Pain location: 1st methead to the inside of his foot Pain description: stiff/tight Aggravating factors:  every time he pushes off his toes while walking after he has been walking for 91mins, lifting, stair negotiation Relieving factors: foot massage  PRECAUTIONS: None  WEIGHT BEARING RESTRICTIONS: No  FALLS:  Has patient fallen in last 6 months? No  LIVING ENVIRONMENT: Lives with: lives with their partner Lives in: House/apartment Stairs: No Has following equipment at home: None  OCCUPATION: unemployed  PLOF: Independent  PATIENT GOALS: be able to walk and run normally without pain  NEXT MD VISIT: not not scheduled  OBJECTIVE:   DIAGNOSTIC FINDINGS:  Radiographic Exam RT foot 12/20/2022:  Stable arthrodesis.  Orthopedic hardware and  arthrodesis sites appear to be stable with routine healing.  Overall radiographically good healing and stability of the surgical sites  PATIENT SURVEYS:  FOTO next visit  COGNITION: Overall cognitive status: Within functional limits for tasks assessed     SENSATION: WFL   POSTURE: decreased lumbar lordosis and weight shift left Navicular drop bilat; midfoot pronation bilat R>L, rests with R foot in eversion   PALPATION: TTP with concordant pain to palpation of GT met head Tension to intrinsic foot muscles  LOWER EXTREMITY ROM:  Active/PROM  Right eval Left eval  Hip flexion    Hip extension 10d 10d  Hip abduction    Hip adduction    Hip internal rotation    Hip external rotation    Knee flexion    Knee extension    Ankle dorsiflexion 7d/10d 18d PROM = AROM  Ankle plantarflexion 30d/35d 39d PROM = AROM  Ankle inversion 0d 10d PROM = AROM  Ankle eversion 31d 56d PROM = AROM   GT PROM: R: 32d L 81  (Blank rows = not tested)  LOWER EXTREMITY MMT:  MMT Right eval Left eval  Hip flexion 5 5  Hip extension 4 4+  Hip abduction 4 4+  Hip adduction    Hip internal rotation 5 5  Hip external rotation 5 5  Knee flexion 5 5  Knee extension 5 5  Ankle dorsiflexion 4 (within avail range) 5  Ankle plantarflexion    Ankle inversion 5 (within avail range) 5  Ankle eversion 5(within avail range) 5  Single Leg bridge  to failure R: 13 L 16 SL Heel raise to failure R: unable L21 (Blank rows = not tested)  LOWER EXTREMITY SPECIAL TESTS:  Ankle special tests: Navicular Drop: positive R foot  FUNCTIONAL TESTS:  5 times sit to stand: 12sec 10 meter walk test: SS: 0.48m/sec fastest: 1.21m/sec SLS R 1sec L 13sec  Squat: ankle eversion exaggerated more in lowering phase, bilat midfoot supination in lowering  GAIT: Distance walked: 70m Assistive device utilized: None Level of assistance: Complete Independence Comments: decreased toe push off R foot, ankle eversion with R swing phase with correction to allow tfor heel strike- gait abnormalities more visualized with "fastest: gait   TODAY'S TREATMENT:                                                                                                                              DATE: 01/24/23 Nustep seat 15 LE only L4 18mins with foot straps tight to allow for DF<>PF focus with good carry over GT mobilization in sitting therapist assisted stabilization with patient heel raise to lower x12 2sec  Lunge position rock for increased GT ext x12 Standard lunge x6 with difficulty with R  GT ext  Heel raise on step with eccentric lower 2x 12 5xSTS x2 trials best time 12sec Squat to depth that does not require heel lift 2x 10 with good carry over of cuing for foot  positionand allowing for increased mobility GT stretch 2x 30secH SLS R: 2sec L: 22sec    PATIENT EDUCATION:  Education details: Patient was educated on diagnosis, anatomy and pathology involved, prognosis, role of PT, and was given an HEP, demonstrating exercise with proper form following verbal and tactile cues, and was given a paper hand out to continue exercise at home. Pt was educated on and agreed to plan of care.  Person educated: Patient Education method: Explanation, Demonstration, Tactile cues, Verbal cues, and Handouts Education comprehension: verbalized understanding and returned demonstration  HOME EXERCISE PROGRAM: [id number]  ASSESSMENT:  CLINICAL IMPRESSION: PT initiated therex progression for increased ankle and GT mobility and RLE stability with success. Patient with difficulty with GT mobility, impacting gait. Pt with good motivation throughout session, without increased pain. Would benefit from skilled PT to address deficits and promote optimal return to PLOF.   OBJECTIVE IMPAIRMENTS: Abnormal gait, decreased activity tolerance, decreased balance, decreased coordination, decreased mobility, difficulty walking, decreased ROM, decreased strength, hypomobility, impaired sensation, improper body mechanics, postural dysfunction, obesity, and pain.   ACTIVITY LIMITATIONS: carrying, lifting, bending, standing, squatting, stairs, transfers, bathing, and locomotion level  PARTICIPATION LIMITATIONS: meal prep, cleaning, shopping, community activity, and yard work  PERSONAL FACTORS: Fitness, Past/current experiences, Profession, Time since onset of injury/illness/exacerbation, and 1-2 comorbidities: BMI, OA, pes planus  are also affecting patient's functional outcome.   REHAB POTENTIAL: Good  CLINICAL  DECISION MAKING: Evolving/moderate complexity  EVALUATION COMPLEXITY: Moderate   GOALS: Goals reviewed with patient? Yes  SHORT TERM GOALS: Target date: 03/03/23 Pt will be independent with HEP in order to improve strength and balance in order to decrease fall risk and improve function at home and work.  Baseline: HEP given  Goal status: INITIAL    LONG TERM GOALS: Target date: 04/13/23  Patient will increase FOTO score to 79 to demonstrate predicted increase in functional mobility to complete ADLs Baseline: 01/23/33 Goal status: INITIAL  2.  Pt will decrease worst pain as reported on NPRS by at least 3 points in order to demonstrate clinically significant reduction in pain.  Baseline:  Goal status: INITIAL  3.  Pt will demonstrate at least 50d of R great toe ext to be able to provide reasonable increase in mobility needed to improve gait mechanics and reduce pain Baseline: 01/24/23 32d Goal status: INITIAL  4.  Pt will demonstrate SLS time of 45sec bilat in order to demonstrate age matched static balance of improvement on ankle stability needed for ankle strategy Baseline: 01/24/23 R 1sec L 13sec Goal status: INITIAL  5.  Pt will demonstrate at least 14 R SL bridges to failure in order to demonstrate 90% hip ext strength of unaffected side to complete heavy ADLs without compensation Baseline: 01/24/23 R 13 L 17 Goal status: INITIAL  6.  Pt will demonstrate at least 18 SL heel raises on RLE (in available range) to demonstrate 90% endurance of unaffected side in order to ambulate in community without fatigue or compensation Baseline: R: unable L 21reps Goal status: INITIAL   PLAN:  PT FREQUENCY: 1-2x/week  PT DURATION: 8 weeks  PLANNED INTERVENTIONS: Therapeutic exercises, Therapeutic activity, Neuromuscular re-education, Balance training, Gait training, Patient/Family education, Self Care, Joint mobilization, Joint manipulation, Stair training, DME instructions, Dry Needling,  Electrical stimulation, Spinal manipulation, Spinal mobilization, Cryotherapy, Moist heat, Traction, Manual therapy, and Re-evaluation  PLAN FOR NEXT SESSION: HEP review, [place] DPT Durwin Reges, PT 01/26/2023, 12:56 PM

## 2023-01-27 ENCOUNTER — Ambulatory Visit: Payer: BC Managed Care – PPO | Admitting: Podiatry

## 2023-01-31 ENCOUNTER — Ambulatory Visit: Payer: 59 | Admitting: Physical Therapy

## 2023-01-31 ENCOUNTER — Encounter: Payer: BC Managed Care – PPO | Admitting: Physical Therapy

## 2023-01-31 ENCOUNTER — Encounter: Payer: Self-pay | Admitting: Physical Therapy

## 2023-01-31 DIAGNOSIS — M2140 Flat foot [pes planus] (acquired), unspecified foot: Secondary | ICD-10-CM | POA: Diagnosis not present

## 2023-01-31 DIAGNOSIS — Z9889 Other specified postprocedural states: Secondary | ICD-10-CM | POA: Diagnosis not present

## 2023-01-31 DIAGNOSIS — M25571 Pain in right ankle and joints of right foot: Secondary | ICD-10-CM | POA: Diagnosis not present

## 2023-01-31 DIAGNOSIS — M62461 Contracture of muscle, right lower leg: Secondary | ICD-10-CM | POA: Diagnosis not present

## 2023-01-31 NOTE — Therapy (Signed)
OUTPATIENT PHYSICAL THERAPY LOWER EXTREMITY EVALUATION   Patient Name: Frederick Delgado MRN: [id number] DOB:1991-07-24, 32 y.o., male Today's Date: 01/31/2023  END OF SESSION:  PT End of Session - 01/31/23 1303     Visit Number 3    Number of Visits 17    Date for PT Re-Evaluation 04/14/23    Authorization - Visit Number 3    Authorization - Number of Visits 10    Progress Note Due on Visit 10    PT Start Time 1300    PT Stop Time 1339    PT Time Calculation (min) 39 min    Activity Tolerance Patient tolerated treatment well    Behavior During Therapy [facility] for tasks assessed/performed               Past Medical History:  Diagnosis Date   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 No pertinent surgical history. Patient Active Problem List   Diagnosis Date Noted   Morbid obesity with BMI of 45.0-49.9, adult 01/25/2022   Annual physical exam 01/25/2022   Flat foot 11/26/2021   Localized osteoarthritis of right ankle 11/26/2021   Achilles tendinitis, right leg 11/26/2021   Posterior tibial tendinitis, right 11/26/2021    PCP: Joseph Berkshire   REFERRING PROVIDER: Ashley Royalty MD  REFERRING DIAG: Larey Days  THERAPY DIAG:  Pain in right ankle and joints of right foot  Rationale for Evaluation and Treatment: Rehabilitation  ONSET DATE: May 2023  SUBJECTIVE:   SUBJECTIVE STATEMENT: Pt reports he has been completing his HEP and he has a little soreness from this but is feeling less pain overall. Reports 2/10 pain this am.   PERTINENT HISTORY: Pt is a 32 year old triple arthrodesis of the right foot 03/10/22. Pt reports post surgery he thought he was doing really well. Over the past couple months he has realizes he is having more difficulty than he realized. He reports he tried to help a friend move a couple months ago and reports it really bothered his foot. He realizes he feels pain every time he pushes off his toes while walking after he has been walking for  , lifting, stair negotiation. Pt is trying to walk for exercise at least most days of the week. Pain is a stiff/tight pain that he feels at the 1st methead to the inside of his foot. Current pain 3/10; best 1/10; worst 9/10. Feels like he is not weight bearing through his "whole foot"- "like I'm walking on the outside of my foot". Pt is not working currently, previously enjoyed playing basketball and playing pokemon go. Currently reports being unable to complete those activities, spending most of his time at home playing video games.   PAIN:  Are you having pain? Yes: NPRS scale: 3/10 Pain location: 1st methead to the inside of his foot Pain description: stiff/tight Aggravating factors:  every time he pushes off his toes while walking after he has been walking for , lifting, stair negotiation Relieving factors: foot massage  PRECAUTIONS: None  WEIGHT BEARING RESTRICTIONS: No  FALLS:  Has patient fallen in last 6 months? No  LIVING ENVIRONMENT: Lives with: lives with their partner Lives in: House/apartment Stairs: No Has following equipment at home: None  OCCUPATION: unemployed  PLOF: Independent  PATIENT GOALS: be able to walk and run normally without pain  NEXT MD VISIT: not not scheduled  OBJECTIVE:   DIAGNOSTIC FINDINGS:  Radiographic Exam RT foot 12/20/2022:  Stable arthrodesis.  Orthopedic hardware  and arthrodesis sites appear to be stable with routine healing.  Overall radiographically good healing and stability of the surgical sites  PATIENT SURVEYS:  FOTO next visit  COGNITION: Overall cognitive status: Within functional limits for tasks assessed     SENSATION: WFL   POSTURE: decreased lumbar lordosis and weight shift left Navicular drop bilat; midfoot pronation bilat R>L, rests with R foot in eversion   PALPATION: TTP with concordant pain to palpation of GT met head Tension to intrinsic foot muscles  LOWER EXTREMITY ROM:  Active/PROM  Right eval Left eval  Hip flexion    Hip extension 10d 10d  Hip abduction    Hip adduction    Hip internal rotation    Hip external rotation    Knee flexion    Knee extension    Ankle dorsiflexion 7d/10d 18d PROM = AROM  Ankle plantarflexion 30d/35d 39d PROM = AROM  Ankle inversion 0d 10d PROM = AROM  Ankle eversion 31d 56d PROM = AROM   GT PROM: R: 32d L 81  (Blank rows = not tested)  LOWER EXTREMITY MMT:  MMT Right eval Left eval  Hip flexion 5 5  Hip extension 4 4+  Hip abduction 4 4+  Hip adduction    Hip internal rotation 5 5  Hip external rotation 5 5  Knee flexion 5 5  Knee extension 5 5  Ankle dorsiflexion 4 (within avail range) 5  Ankle plantarflexion    Ankle inversion 5 (within avail range) 5  Ankle eversion 5(within avail range) 5  Single Leg bridge  to failure R: 13 L 16 SL Heel raise to failure R: unable L21 (Blank rows = not tested)  LOWER EXTREMITY SPECIAL TESTS:  Ankle special tests: Navicular Drop: positive R foot  FUNCTIONAL TESTS:  5 times sit to stand: 12sec 10 meter walk test: SS: 0.67m/sec fastest: 1.8m/sec SLS R 1sec L 13sec  Squat: ankle eversion exaggerated more in lowering phase, bilat midfoot supination in lowering  GAIT: Distance walked: 63m Assistive device utilized: None Level of assistance: Complete Independence Comments: decreased toe push off R foot, ankle eversion with R swing phase with correction to allow tfor heel strike- gait abnormalities more visualized with "fastest: gait   TODAY'S TREATMENT:                                                                                                                              DATE: 01/24/23 Nustep seat 15 LE only L4 with foot straps tight to allow for DF<>PF focus with good carry over GT mobilization in sitting with toe on pillowcase roll with patient heel raise to lower x12 2sec  Seated heel raise with self resistance 2x 12  Standing heel raise 2x 12  Standard lunge x6  with difficulty with R GT ext  Heel raise on step with eccentric lower 2x 12 Squat to depth that does not require heel lift x10; on foam x10 with good  carry over GT stretch 2x 30secH    PATIENT EDUCATION:  Education details: Patient was educated on diagnosis, anatomy and pathology involved, prognosis, role of PT, and was given an HEP, demonstrating exercise with proper form following verbal and tactile cues, and was given a paper hand out to continue exercise at home. Pt was educated on and agreed to plan of care.  Person educated: Patient Education method: Explanation, Demonstration, Tactile cues, Verbal cues, and Handouts Education comprehension: verbalized understanding and returned demonstration  HOME EXERCISE PROGRAM: [id number]  ASSESSMENT:  CLINICAL IMPRESSION: PT initiated therex progression for increased ankle and GT mobility and RLE stability with success. Patient with difficulty with GT mobility, impacting gait. Pt with good motivation throughout session, without increased pain. Would benefit from skilled PT to address deficits and promote optimal return to PLOF.   OBJECTIVE IMPAIRMENTS: Abnormal gait, decreased activity tolerance, decreased balance, decreased coordination, decreased mobility, difficulty walking, decreased ROM, decreased strength, hypomobility, impaired sensation, improper body mechanics, postural dysfunction, obesity, and pain.   ACTIVITY LIMITATIONS: carrying, lifting, bending, standing, squatting, stairs, transfers, bathing, and locomotion level  PARTICIPATION LIMITATIONS: meal prep, cleaning, shopping, community activity, and yard work  PERSONAL FACTORS: Fitness, Past/current experiences, Profession, Time since onset of injury/illness/exacerbation, and 1-2 comorbidities: BMI, OA, pes planus  are also affecting patient's functional outcome.   REHAB POTENTIAL: Good  CLINICAL DECISION MAKING: Evolving/moderate complexity  EVALUATION COMPLEXITY:  Moderate   GOALS: Goals reviewed with patient? Yes  SHORT TERM GOALS: Target date: 03/03/23 Pt will be independent with HEP in order to improve strength and balance in order to decrease fall risk and improve function at home and work.  Baseline: HEP given  Goal status: INITIAL    LONG TERM GOALS: Target date: 04/13/23  Patient will increase FOTO score to 79 to demonstrate predicted increase in functional mobility to complete ADLs Baseline: 01/23/33 Goal status: INITIAL  2.  Pt will decrease worst pain as reported on NPRS by at least 3 points in order to demonstrate clinically significant reduction in pain.  Baseline:  Goal status: INITIAL  3.  Pt will demonstrate at least 50d of R great toe ext to be able to provide reasonable increase in mobility needed to improve gait mechanics and reduce pain Baseline: 01/24/23 32d Goal status: INITIAL  4.  Pt will demonstrate SLS time of 45sec bilat in order to demonstrate age matched static balance of improvement on ankle stability needed for ankle strategy Baseline: 01/24/23 R 1sec L 13sec Goal status: INITIAL  5.  Pt will demonstrate at least 14 R SL bridges to failure in order to demonstrate 90% hip ext strength of unaffected side to complete heavy ADLs without compensation Baseline: 01/24/23 R 13 L 17 Goal status: INITIAL  6.  Pt will demonstrate at least 18 SL heel raises on RLE (in available range) to demonstrate 90% endurance of unaffected side in order to ambulate in community without fatigue or compensation Baseline: R: unable L 21reps Goal status: INITIAL   PLAN:  PT FREQUENCY: 1-2x/week  PT DURATION: 8 weeks  PLANNED INTERVENTIONS: Therapeutic exercises, Therapeutic activity, Neuromuscular re-education, Balance training, Gait training, Patient/Family education, Self Care, Joint mobilization, Joint manipulation, Stair training, DME instructions, Dry Needling, Electrical stimulation, Spinal manipulation, Spinal mobilization,  Cryotherapy, Moist heat, Traction, Manual therapy, and Re-evaluation  PLAN FOR NEXT SESSION: HEP review, FOTO  Hilda Liashelsea Jamieon Lannen DPT Hilda Liashelsea Mizani Dilday, PT 01/31/2023, 1:33 PM

## 2023-02-01 NOTE — Therapy (Signed)
OUTPATIENT PHYSICAL THERAPY LOWER EXTREMITY TREATMENT   Patient Name: Frederick Delgado MRN: [id number] DOB:[birth year], 32 y.o., male Today's Date: 02/02/2023  END OF SESSION:  PT End of Session - 02/02/23 1310     Visit Number 4    Number of Visits 17    Date for PT Re-Evaluation 04/14/23    Authorization - Visit Number 4    Authorization - Number of Visits 10    Progress Note Due on Visit 10    PT Start Time 1306    PT Stop Time 1345    PT Time Calculation (min) 39 min    Activity Tolerance Patient tolerated treatment well    [facility] assessed/performed                Past Medical History:  Diagnosis Date   Arthritis 2016   right foot   No past surgical history on file. Patient Active Problem List   Diagnosis Date Noted   Morbid obesity with BMI of 45.0-49.9, adult 01/25/2022   Annual physical exam 01/25/2022   Flat foot 11/26/2021   Localized osteoarthritis of right ankle 11/26/2021   Achilles tendinitis, right leg 11/26/2021   Posterior tibial tendinitis, right 11/26/2021    PCP: Joseph BerkshireMatthews Jason   REFERRING PROVIDER: Ashley RoyaltyMatthews MD  REFERRING DIAG: Larey DaysEvans DPM  THERAPY DIAG:  Pain in right ankle and joints of right foot  Rationale for Evaluation and Treatment: Rehabilitation  ONSET DATE: May 2023  SUBJECTIVE:   SUBJECTIVE STATEMENT:  Pt reports no pain upon arrival to the clinic.  Pt states he is doing well with HEP only noting complications from with stretching the toe. Pt states it's just more difficult, but it is getting better/easier.  PERTINENT HISTORY: Pt is a 32 year old triple arthrodesis of the right foot 03/10/22. Pt reports post surgery he thought he was doing really well. Over the past couple months he has realizes he is having more difficulty than he realized. He reports he tried to help a friend move a couple months ago and reports it really bothered his foot. He realizes he feels pain every time he pushes off  his toes while walking after he has been walking for 30mins, lifting, stair negotiation. Pt is trying to walk for exercise at least 15min most days of the week. Pain is a stiff/tight pain that he feels at the 1st methead to the inside of his foot. Current pain 3/10; best 1/10; worst 9/10. Feels like he is not weight bearing through his "whole foot"- "like I'm walking on the outside of my foot". Pt is not working currently, previously enjoyed playing basketball and playing pokemon go. Currently reports being unable to complete those activities, spending most of his time at home playing video games.   PAIN:  Are you having pain? Yes: NPRS scale: 0/10 Pain location: 1st methead to the inside of his foot Pain description: stiff/tight Aggravating factors:  every time he pushes off his toes while walking after he has been walking for 30mins, lifting, stair negotiation Relieving factors: foot massage  PRECAUTIONS: None  WEIGHT BEARING RESTRICTIONS: No  FALLS:  Has patient fallen in last 6 months? No  LIVING ENVIRONMENT: Lives with: lives with their partner Lives in: House/apartment Stairs: No Has following equipment at home: None  OCCUPATION: unemployed  PLOF: Independent  PATIENT GOALS: be able to walk and run normally without pain  NEXT MD VISIT: not not scheduled  OBJECTIVE:   DIAGNOSTIC FINDINGS:  Radiographic Exam RT foot 12/20/2022:  Stable arthrodesis.  Orthopedic hardware and arthrodesis sites appear to be stable with routine healing.  Overall radiographically good healing and stability of the surgical sites  PATIENT SURVEYS:  FOTO next visit  COGNITION: Overall cognitive status: Within functional limits for tasks assessed     SENSATION: WFL   POSTURE: decreased lumbar lordosis and weight shift left Navicular drop bilat; midfoot pronation bilat R>L, rests with R foot in eversion   PALPATION: TTP with concordant pain to palpation of GT met head Tension to intrinsic  foot muscles  LOWER EXTREMITY ROM:  Active/PROM Right eval Left eval  Hip flexion    Hip extension 10d 10d  Hip abduction    Hip adduction    Hip internal rotation    Hip external rotation    Knee flexion    Knee extension    Ankle dorsiflexion 7d/10d 18d PROM = AROM  Ankle plantarflexion 30d/35d 39d PROM = AROM  Ankle inversion 0d 10d PROM = AROM  Ankle eversion 31d 56d PROM = AROM   GT PROM: R: 32d L 81  (Blank rows = not tested)  LOWER EXTREMITY MMT:  MMT Right eval Left eval  Hip flexion 5 5  Hip extension 4 4+  Hip abduction 4 4+  Hip adduction    Hip internal rotation 5 5  Hip external rotation 5 5  Knee flexion 5 5  Knee extension 5 5  Ankle dorsiflexion 4 (within avail range) 5  Ankle plantarflexion    Ankle inversion 5 (within avail range) 5  Ankle eversion 5(within avail range) 5  Single Leg bridge  to failure R: 13 L 16 SL Heel raise to failure R: unable L21 (Blank rows = not tested)  LOWER EXTREMITY SPECIAL TESTS:  Ankle special tests: Navicular Drop: positive R foot  FUNCTIONAL TESTS:  5 times sit to stand: 12sec 10 meter walk test: SS: 0.91m/sec fastest: 1.46m/sec SLS R 1sec L 13sec  Squat: ankle eversion exaggerated more in lowering phase, bilat midfoot supination in lowering  GAIT: Distance walked: 57m Assistive device utilized: None Level of assistance: Complete Independence Comments: decreased toe push off R foot, ankle eversion with R swing phase with correction to allow tfor heel strike- gait abnormalities more visualized with "fastest: gait   TODAY'S TREATMENT: DATE: 02/02/23  TherEx:  Nustep seat 15 LE only L4 with foot straps tight to allow for DF<>PF focus with good carry over GT mobilization in sitting with toe on pillowcase roll with patient heel raise to lower x12 2sec  Seated heel raise with self resistance 2x12  Seated towel scrunch, 30 sec bouts, x3 Seated arch lifts, 2x10 Seated toe yoga (raising GT, leaving  other toes down), 2x10 Seated toe spreading Standing heel raise 2x12  Standard lunge x6 with difficulty with R GT ext  GT stretch 2x 30secH    PATIENT EDUCATION:  Education details: Patient was educated on diagnosis, anatomy and pathology involved, prognosis, role of PT, and was given an HEP, demonstrating exercise with proper form following verbal and tactile cues, and was given a paper hand out to continue exercise at home. Pt was educated on and agreed to plan of care.  Person educated: Patient Education method: Explanation, Demonstration, Tactile cues, Verbal cues, and Handouts Education comprehension: verbalized understanding and returned demonstration  HOME EXERCISE PROGRAM: [email or web address]  ASSESSMENT:  CLINICAL IMPRESSION:  Pt performed well with the exercises and was introduced to new exercises to challenge toe mobility and improved musculature  strength in the foot.  Pt able to tolerate the increased strengthening exercises without any increase in the pain he was experiencing prior.  Pt given update HEP and will be assessed for compliance at next visit.   Pt will continue to benefit from skilled therapy to address remaining deficits in order to improve overall QoL and return to PLOF.       OBJECTIVE IMPAIRMENTS: Abnormal gait, decreased activity tolerance, decreased balance, decreased coordination, decreased mobility, difficulty walking, decreased ROM, decreased strength, hypomobility, impaired sensation, improper body mechanics, postural dysfunction, obesity, and pain.   ACTIVITY LIMITATIONS: carrying, lifting, bending, standing, squatting, stairs, transfers, bathing, and locomotion level  PARTICIPATION LIMITATIONS: meal prep, cleaning, shopping, community activity, and yard work  PERSONAL FACTORS: Fitness, Past/current experiences, Profession, Time since onset of injury/illness/exacerbation, and 1-2 comorbidities: BMI, OA, pes planus  are also affecting patient's functional  outcome.   REHAB POTENTIAL: Good  CLINICAL DECISION MAKING: Evolving/moderate complexity  EVALUATION COMPLEXITY: Moderate   GOALS: Goals reviewed with patient? Yes  SHORT TERM GOALS: Target date: 03/03/23 Pt will be independent with HEP in order to improve strength and balance in order to decrease fall risk and improve function at home and work.  Baseline: HEP given  Goal status: INITIAL    LONG TERM GOALS: Target date: 04/13/23  Patient will increase FOTO score to 79 to demonstrate predicted increase in functional mobility to complete ADLs Baseline: 01/23/33 Goal status: INITIAL  2.  Pt will decrease worst pain as reported on NPRS by at least 3 points in order to demonstrate clinically significant reduction in pain.  Baseline:  Goal status: INITIAL  3.  Pt will demonstrate at least 50d of R great toe ext to be able to provide reasonable increase in mobility needed to improve gait mechanics and reduce pain Baseline: 01/24/23 32d Goal status: INITIAL  4.  Pt will demonstrate SLS time of 45sec bilat in order to demonstrate age matched static balance of improvement on ankle stability needed for ankle strategy Baseline: 01/24/23 R 1sec L 13sec Goal status: INITIAL  5.  Pt will demonstrate at least 14 R SL bridges to failure in order to demonstrate 90% hip ext strength of unaffected side to complete heavy ADLs without compensation Baseline: 01/24/23 R 13 L 17 Goal status: INITIAL  6.  Pt will demonstrate at least 18 SL heel raises on RLE (in available range) to demonstrate 90% endurance of unaffected side in order to ambulate in community without fatigue or compensation Baseline: R: unable L 21reps Goal status: INITIAL   PLAN:  PT FREQUENCY: 1-2x/week  PT DURATION: 8 weeks  PLANNED INTERVENTIONS: Therapeutic exercises, Therapeutic activity, Neuromuscular re-education, Balance training, Gait training, Patient/Family education, Self Care, Joint mobilization, Joint manipulation,  Stair training, DME instructions, Dry Needling, Electrical stimulation, Spinal manipulation, Spinal mobilization, Cryotherapy, Moist heat, Traction, Manual therapy, and Re-evaluation  PLAN FOR NEXT SESSION: HEP review, FOTO   Nolon Bussing, PT, DPT Physical Therapist - [place]  [facility]  02/02/23, 4:17 PM

## 2023-02-02 ENCOUNTER — Encounter: Payer: BC Managed Care – PPO | Admitting: Physical Therapy

## 2023-02-02 ENCOUNTER — Ambulatory Visit: Payer: 59

## 2023-02-02 DIAGNOSIS — M25571 Pain in right ankle and joints of right foot: Secondary | ICD-10-CM

## 2023-02-02 DIAGNOSIS — Z9889 Other specified postprocedural states: Secondary | ICD-10-CM | POA: Diagnosis not present

## 2023-02-02 DIAGNOSIS — M2140 Flat foot [pes planus] (acquired), unspecified foot: Secondary | ICD-10-CM | POA: Diagnosis not present

## 2023-02-02 DIAGNOSIS — M62461 Contracture of muscle, right lower leg: Secondary | ICD-10-CM | POA: Diagnosis not present

## 2023-02-07 ENCOUNTER — Ambulatory Visit: Payer: 59

## 2023-02-07 ENCOUNTER — Encounter: Payer: BC Managed Care – PPO | Admitting: Physical Therapy

## 2023-02-09 ENCOUNTER — Encounter: Payer: BC Managed Care – PPO | Admitting: Physical Therapy

## 2023-02-09 ENCOUNTER — Ambulatory Visit: Payer: 59

## 2023-02-09 DIAGNOSIS — M2140 Flat foot [pes planus] (acquired), unspecified foot: Secondary | ICD-10-CM | POA: Diagnosis not present

## 2023-02-09 DIAGNOSIS — M25571 Pain in right ankle and joints of right foot: Secondary | ICD-10-CM | POA: Diagnosis not present

## 2023-02-09 DIAGNOSIS — M62461 Contracture of muscle, right lower leg: Secondary | ICD-10-CM | POA: Diagnosis not present

## 2023-02-09 DIAGNOSIS — Z9889 Other specified postprocedural states: Secondary | ICD-10-CM | POA: Diagnosis not present

## 2023-02-09 NOTE — Therapy (Signed)
OUTPATIENT PHYSICAL THERAPY LOWER EXTREMITY TREATMENT   Patient Name: Frederick Delgado MRN: [id number] DOB:August 04, 1991, 32 y.o., male Today's Date: 02/09/2023  END OF SESSION:  PT End of Session - 02/09/23 1302     Visit Number 5    Number of Visits 17    Date for PT Re-Evaluation 04/14/23    Authorization - Visit Number 5    Authorization - Number of Visits 10    Progress Note Due on Visit 10    PT Start Time 1303    PT Stop Time 1345    PT Time Calculation (min) 42 min    Activity Tolerance Patient tolerated treatment well    Behavior During Therapy [facility] for tasks assessed/performed                Past Medical History:  Diagnosis Date   Arthritis 2016   right foot   No past surgical history on file. Patient Active Problem List   Diagnosis Date Noted   Morbid obesity with BMI of 45.0-49.9, adult 01/25/2022   Annual physical exam 01/25/2022   Flat foot 11/26/2021   Localized osteoarthritis of right ankle 11/26/2021   Achilles tendinitis, right leg 11/26/2021   Posterior tibial tendinitis, right 11/26/2021    PCP: Joseph Berkshire   REFERRING PROVIDER: Ashley Royalty MD  REFERRING DIAG: Larey Days  THERAPY DIAG:  Pain in right ankle and joints of right foot  Rationale for Evaluation and Treatment: Rehabilitation  ONSET DATE: May 2023  SUBJECTIVE:   SUBJECTIVE STATEMENT:  Pt reports he wasn't able to make it to the other day due to not having a ride here.  Pt did report he was able to do his exercises however.   PERTINENT HISTORY: Pt is a 32 year old triple arthrodesis of the right foot 03/10/22. Pt reports post surgery he thought he was doing really well. Over the past couple months he has realizes he is having more difficulty than he realized. He reports he tried to help a friend move a couple months ago and reports it really bothered his foot. He realizes he feels pain every time he pushes off his toes while walking after he has been walking for ,  lifting, stair negotiation. Pt is trying to walk for exercise at least most days of the week. Pain is a stiff/tight pain that he feels at the 1st methead to the inside of his foot. Current pain 3/10; best 1/10; worst 9/10. Feels like he is not weight bearing through his "whole foot"- "like I'm walking on the outside of my foot". Pt is not working currently, previously enjoyed playing basketball and playing pokemon go. Currently reports being unable to complete those activities, spending most of his time at home playing video games.   PAIN:  Are you having pain? Yes: NPRS scale: 0/10 Pain location: 1st methead to the inside of his foot Pain description: stiff/tight Aggravating factors:  every time he pushes off his toes while walking after he has been walking for , lifting, stair negotiation Relieving factors: foot massage  PRECAUTIONS: None  WEIGHT BEARING RESTRICTIONS: No  FALLS:  Has patient fallen in last 6 months? No  LIVING ENVIRONMENT: Lives with: lives with their partner Lives in: House/apartment Stairs: No Has following equipment at home: None  OCCUPATION: unemployed  PLOF: Independent  PATIENT GOALS: be able to walk and run normally without pain  NEXT MD VISIT: not not scheduled  OBJECTIVE:  DIAGNOSTIC FINDINGS:   Radiographic Exam RT foot 12/20/2022:  Stable arthrodesis.  Orthopedic hardware and arthrodesis sites appear to be stable with routine healing.  Overall radiographically good healing and stability of the surgical sites  PATIENT SURVEYS:  FOTO next visit  COGNITION: Overall cognitive status: Within functional limits for tasks assessed     SENSATION: WFL   POSTURE: decreased lumbar lordosis and weight shift left Navicular drop bilat; midfoot pronation bilat R>L, rests with R foot in eversion   PALPATION: TTP with concordant pain to palpation of GT met head Tension to intrinsic foot muscles  LOWER EXTREMITY ROM:  Active/PROM  Right eval Left eval  Hip flexion    Hip extension 10d 10d  Hip abduction    Hip adduction    Hip internal rotation    Hip external rotation    Knee flexion    Knee extension    Ankle dorsiflexion 7d/10d 18d PROM = AROM  Ankle plantarflexion 30d/35d 39d PROM = AROM  Ankle inversion 0d 10d PROM = AROM  Ankle eversion 31d 56d PROM = AROM   GT PROM: R: 32d L 81  (Blank rows = not tested)  LOWER EXTREMITY MMT:  MMT Right eval Left eval  Hip flexion 5 5  Hip extension 4 4+  Hip abduction 4 4+  Hip adduction    Hip internal rotation 5 5  Hip external rotation 5 5  Knee flexion 5 5  Knee extension 5 5  Ankle dorsiflexion 4 (within avail range) 5  Ankle plantarflexion    Ankle inversion 5 (within avail range) 5  Ankle eversion 5(within avail range) 5   Single Leg bridge  to failure R: 13 L 16 SL Heel raise to failure R: unable L21 (Blank rows = not tested)  LOWER EXTREMITY SPECIAL TESTS:  Ankle special tests: Navicular Drop: positive R foot  FUNCTIONAL TESTS:  5 times sit to stand: 12sec 10 meter walk test: SS: 0.54m/sec fastest: 1.28m/sec SLS R 1sec L 13sec  Squat: ankle eversion exaggerated more in lowering phase, bilat midfoot supination in lowering   GAIT:  Distance walked: 25m Assistive device utilized: None Level of assistance: Complete Independence Comments: decreased toe push off R foot, ankle eversion with R swing phase with correction to allow tfor heel strike- gait abnormalities more visualized with "fastest: gait   TODAY'S TREATMENT: DATE: 02/09/23   TherEx:  Nustep seat 15 LE only L4 with foot straps tight to allow for DF<>PF focus with good carry over GT mobilization in sitting with toe on 2# AW with patient heel raise to lower x12 2sec  Seated heel raise with self resistance 2x12  Seated towel scrunch, 30 sec bouts, x3 Seated arch lifts, 2x10 Seated toe yoga (raising GT, leaving other toes down), 2x10 Seated toe spreading Standing  heel raise 2x12  Standard lunge x6 with difficulty with R GT ext  GT stretch 2x30 sec H  FOTO taken today due to not being given at evaluation and update below.    PATIENT EDUCATION:  Education details: Patient was educated on diagnosis, anatomy and pathology involved, prognosis, role of PT, and was given an HEP, demonstrating exercise with proper form following verbal and tactile cues, and was given a paper hand out to continue exercise at home. Pt was educated on and agreed to plan of care.  Person educated: Patient Education method: Explanation, Demonstration, Tactile cues, Verbal cues, and Handouts Education comprehension: verbalized understanding and returned demonstration  HOME EXERCISE PROGRAM: [email or web address]  ASSESSMENT:  CLINICAL IMPRESSION:  Pt continues to respond well to  the exercises.  Pt ws able to achieve greater ROM with the GT extension exercises by utilization of elevated surface to stretch on (2# dumbbell handle).  Pt is making good progress towards ROM goals, however needs to improve overall strength of the ankle and intrinsic foot muscles.   Pt will continue to benefit from skilled therapy to address remaining deficits in order to improve overall QoL and return to PLOF.       OBJECTIVE IMPAIRMENTS: Abnormal gait, decreased activity tolerance, decreased balance, decreased coordination, decreased mobility, difficulty walking, decreased ROM, decreased strength, hypomobility, impaired sensation, improper body mechanics, postural dysfunction, obesity, and pain.   ACTIVITY LIMITATIONS: carrying, lifting, bending, standing, squatting, stairs, transfers, bathing, and locomotion level  PARTICIPATION LIMITATIONS: meal prep, cleaning, shopping, community activity, and yard work  PERSONAL FACTORS: Fitness, Past/current experiences, Profession, Time since onset of injury/illness/exacerbation, and 1-2 comorbidities: BMI, OA, pes planus  are also affecting patient's functional  outcome.   REHAB POTENTIAL: Good  CLINICAL DECISION MAKING: Evolving/moderate complexity  EVALUATION COMPLEXITY: Moderate   GOALS: Goals reviewed with patient? Yes  SHORT TERM GOALS: Target date: 03/03/23 Pt will be independent with HEP in order to improve strength and balance in order to decrease fall risk and improve function at home and work.  Baseline: HEP given  Goal status: INITIAL    LONG TERM GOALS: Target date: 04/13/23  Patient will increase FOTO score to 66 to demonstrate predicted increase in functional mobility to complete ADLs Baseline: 02/09/23: 49 Goal status: INITIAL  2.  Pt will decrease worst pain as reported on NPRS by at least 3 points in order to demonstrate clinically significant reduction in pain.  Baseline:  Goal status: INITIAL  3.  Pt will demonstrate at least 50d of R great toe ext to be able to provide reasonable increase in mobility needed to improve gait mechanics and reduce pain Baseline: 01/24/23 32d Goal status: INITIAL  4.  Pt will demonstrate SLS time of 45sec bilat in order to demonstrate age matched static balance of improvement on ankle stability needed for ankle strategy Baseline: 01/24/23 R 1sec L 13sec Goal status: INITIAL  5.  Pt will demonstrate at least 14 R SL bridges to failure in order to demonstrate 90% hip ext strength of unaffected side to complete heavy ADLs without compensation Baseline: 01/24/23 R 13 L 17 Goal status: INITIAL  6.  Pt will demonstrate at least 18 SL heel raises on RLE (in available range) to demonstrate 90% endurance of unaffected side in order to ambulate in community without fatigue or compensation Baseline: R: unable L 21reps Goal status: INITIAL   PLAN:  PT FREQUENCY: 1-2x/week  PT DURATION: 8 weeks  PLANNED INTERVENTIONS: Therapeutic exercises, Therapeutic activity, Neuromuscular re-education, Balance training, Gait training, Patient/Family education, Self Care, Joint mobilization, Joint  manipulation, Stair training, DME instructions, Dry Needling, Electrical stimulation, Spinal manipulation, Spinal mobilization, Cryotherapy, Moist heat, Traction, Manual therapy, and Re-evaluation  PLAN FOR NEXT SESSION:  HEP review,    Nolon Bussing, PT, DPT Physical Therapist - [place]  [facility]  02/09/23, 4:28 PM

## 2023-02-14 ENCOUNTER — Encounter: Payer: BC Managed Care – PPO | Admitting: Physical Therapy

## 2023-02-14 ENCOUNTER — Ambulatory Visit: Payer: 59

## 2023-02-14 DIAGNOSIS — M62461 Contracture of muscle, right lower leg: Secondary | ICD-10-CM | POA: Diagnosis not present

## 2023-02-14 DIAGNOSIS — M2140 Flat foot [pes planus] (acquired), unspecified foot: Secondary | ICD-10-CM | POA: Diagnosis not present

## 2023-02-14 DIAGNOSIS — M25571 Pain in right ankle and joints of right foot: Secondary | ICD-10-CM | POA: Diagnosis not present

## 2023-02-14 DIAGNOSIS — Z9889 Other specified postprocedural states: Secondary | ICD-10-CM | POA: Diagnosis not present

## 2023-02-14 NOTE — Patient Instructions (Signed)
Access Code: [email or web address] URL: [email or web address] Date: 02/14/2023 Prepared by: Tomasa Hose  Exercises - Long Sitting Ankle Eversion with Resistance  - 1 x daily - 7 x weekly - 3 sets - 10 reps - Long Sitting Ankle Inversion with Anchored Resistance  - 1 x daily - 7 x weekly - 3 sets - 10 reps - Seated Ankle Dorsiflexion with Anchored Resistance - Leg Straight  - 1 x daily - 7 x weekly - 3 sets - 10 reps - Seated Ankle Plantarflexion with Resistance  - 1 x daily - 7 x weekly - 3 sets - 10 reps

## 2023-02-14 NOTE — Therapy (Signed)
OUTPATIENT PHYSICAL THERAPY LOWER EXTREMITY TREATMENT   Patient Name: Frederick Delgado MRN: [id number] DOB:1990/11/05, 32 y.o., male Today's Date: 02/14/2023  END OF SESSION:  PT End of Session - 02/14/23 1306     Visit Number 6    Number of Visits 17    Date for PT Re-Evaluation 04/14/23    Authorization - Visit Number 6    Authorization - Number of Visits 10    Progress Note Due on Visit 10    PT Start Time 1304    PT Stop Time 1345    PT Time Calculation (min) 41 min    Activity Tolerance Patient tolerated treatment well    Behavior During Therapy [facility] for tasks assessed/performed                Past Medical History:  Diagnosis Date   Arthritis 2016   right foot   No past surgical history on file. Patient Active Problem List   Diagnosis Date Noted   Morbid obesity with BMI of 45.0-49.9, adult 01/25/2022   Annual physical exam 01/25/2022   Flat foot 11/26/2021   Localized osteoarthritis of right ankle 11/26/2021   Achilles tendinitis, right leg 11/26/2021   Posterior tibial tendinitis, right 11/26/2021    PCP: Joseph Berkshire   REFERRING PROVIDER: Ashley Royalty MD  REFERRING DIAG: Larey Days  THERAPY DIAG:  Pain in right ankle and joints of right foot  Rationale for Evaluation and Treatment: Rehabilitation  ONSET DATE: May 2023  SUBJECTIVE:   SUBJECTIVE STATEMENT:  Pt reports he is doing well, just noting some pain with the stretches.   PERTINENT HISTORY: Pt is a 32 year old triple arthrodesis of the right foot 03/10/22. Pt reports post surgery he thought he was doing really well. Over the past couple months he has realizes he is having more difficulty than he realized. He reports he tried to help a friend move a couple months ago and reports it really bothered his foot. He realizes he feels pain every time he pushes off his toes while walking after he has been walking for , lifting, stair negotiation. Pt is trying to walk for exercise at least  most days of the week. Pain is a stiff/tight pain that he feels at the 1st methead to the inside of his foot. Current pain 3/10; best 1/10; worst 9/10. Feels like he is not weight bearing through his "whole foot"- "like I'm walking on the outside of my foot". Pt is not working currently, previously enjoyed playing basketball and playing pokemon go. Currently reports being unable to complete those activities, spending most of his time at home playing video games.   PAIN:  Are you having pain? Yes: NPRS scale: 0/10 Pain location: 1st methead to the inside of his foot Pain description: stiff/tight Aggravating factors:  every time he pushes off his toes while walking after he has been walking for , lifting, stair negotiation Relieving factors: foot massage  PRECAUTIONS: None  WEIGHT BEARING RESTRICTIONS: No  FALLS:  Has patient fallen in last 6 months? No  LIVING ENVIRONMENT: Lives with: lives with their partner Lives in: House/apartment Stairs: No Has following equipment at home: None  OCCUPATION: unemployed  PLOF: Independent  PATIENT GOALS: be able to walk and run normally without pain  NEXT MD VISIT: not not scheduled  OBJECTIVE:  DIAGNOSTIC FINDINGS:   Radiographic Exam RT foot 12/20/2022:  Stable arthrodesis.  Orthopedic hardware and arthrodesis sites appear to be stable with routine healing.  Overall  radiographically good healing and stability of the surgical sites  PATIENT SURVEYS:  FOTO next visit  COGNITION: Overall cognitive status: Within functional limits for tasks assessed     SENSATION: WFL   POSTURE: decreased lumbar lordosis and weight shift left Navicular drop bilat; midfoot pronation bilat R>L, rests with R foot in eversion   PALPATION: TTP with concordant pain to palpation of GT met head Tension to intrinsic foot muscles  LOWER EXTREMITY ROM:  Active/PROM Right eval Left eval  Hip flexion    Hip extension 10d 10d  Hip abduction    Hip  adduction    Hip internal rotation    Hip external rotation    Knee flexion    Knee extension    Ankle dorsiflexion 7d/10d 18d PROM = AROM  Ankle plantarflexion 30d/35d 39d PROM = AROM  Ankle inversion 0d 10d PROM = AROM  Ankle eversion 31d 56d PROM = AROM   GT PROM: R: 32d L 81  (Blank rows = not tested)  LOWER EXTREMITY MMT:  MMT Right eval Left eval  Hip flexion 5 5  Hip extension 4 4+  Hip abduction 4 4+  Hip adduction    Hip internal rotation 5 5  Hip external rotation 5 5  Knee flexion 5 5  Knee extension 5 5  Ankle dorsiflexion 4 (within avail range) 5  Ankle plantarflexion    Ankle inversion 5 (within avail range) 5  Ankle eversion 5(within avail range) 5   Single Leg bridge  to failure R: 13 L 16 SL Heel raise to failure R: unable L21 (Blank rows = not tested)  LOWER EXTREMITY SPECIAL TESTS:  Ankle special tests: Navicular Drop: positive R foot  FUNCTIONAL TESTS:  5 times sit to stand: 12sec 10 meter walk test: SS: 0.70m/sec fastest: 1.11m/sec SLS R 1sec L 13sec  Squat: ankle eversion exaggerated more in lowering phase, bilat midfoot supination in lowering   GAIT:  Distance walked: 54m Assistive device utilized: None Level of assistance: Complete Independence Comments: decreased toe push off R foot, ankle eversion with R swing phase with correction to allow tfor heel strike- gait abnormalities more visualized with "fastest: gait   TODAY'S TREATMENT: DATE: 02/14/23   TherEx:  Nustep seat 15 LE only L4 with foot straps tight to allow for DF<>PF focus with good carry over GT mobilization in sitting with toe on 2# AW with patient heel raise to lower x12 2sec  Seated heel raise with self resistance 2x12  Seated towel scrunch, 30 sec bouts, x3 Seated arch lifts, 2x10 Seated toe yoga (raising GT, leaving other toes down), 2x10 Seated toe spreading Standing heel raise 2x12  Standard lunge x6 with difficulty with R GT ext  GT stretch 2x30 sec  H Static stance on airex pad, no shoes, 30 sec bouts Static SLS on airex pad on the R LE, no shoe, 30 sec bouts Seated self mobilization of plantar fascia with lacrosse ball, 60 sec  Recommended utilizing toe spacers for foot   PATIENT EDUCATION:  Education details: Patient was educated on diagnosis, anatomy and pathology involved, prognosis, role of PT, and was given an HEP, demonstrating exercise with proper form following verbal and tactile cues, and was given a paper hand out to continue exercise at home. Pt was educated on and agreed to plan of care.  Person educated: Patient Education method: Explanation, Demonstration, Tactile cues, Verbal cues, and Handouts Education comprehension: verbalized understanding and returned demonstration  HOME EXERCISE PROGRAM: [email or web address]  ASSESSMENT:  CLINICAL IMPRESSION:  Pt continues to put forth good effort throughout the session.  Pt given ankle stability exercises and also advised pt to look into getting toe spacers to assist with larger surface area and greater toe recruitment for balance.  Pt also introduced to proprioception exercises on airex pad and will continue to benefit from challenging balance and stbaility of the ankle during future sessions.         OBJECTIVE IMPAIRMENTS: Abnormal gait, decreased activity tolerance, decreased balance, decreased coordination, decreased mobility, difficulty walking, decreased ROM, decreased strength, hypomobility, impaired sensation, improper body mechanics, postural dysfunction, obesity, and pain.   ACTIVITY LIMITATIONS: carrying, lifting, bending, standing, squatting, stairs, transfers, bathing, and locomotion level  PARTICIPATION LIMITATIONS: meal prep, cleaning, shopping, community activity, and yard work  PERSONAL FACTORS: Fitness, Past/current experiences, Profession, Time since onset of injury/illness/exacerbation, and 1-2 comorbidities: BMI, OA, pes planus  are also affecting patient's  functional outcome.   REHAB POTENTIAL: Good  CLINICAL DECISION MAKING: Evolving/moderate complexity  EVALUATION COMPLEXITY: Moderate   GOALS: Goals reviewed with patient? Yes  SHORT TERM GOALS: Target date: 03/03/23 Pt will be independent with HEP in order to improve strength and balance in order to decrease fall risk and improve function at home and work.  Baseline: HEP given  Goal status: INITIAL    LONG TERM GOALS: Target date: 04/13/23  Patient will increase FOTO score to 66 to demonstrate predicted increase in functional mobility to complete ADLs Baseline: 02/09/23: 49 Goal status: INITIAL  2.  Pt will decrease worst pain as reported on NPRS by at least 3 points in order to demonstrate clinically significant reduction in pain.  Baseline:  Goal status: INITIAL  3.  Pt will demonstrate at least 50d of R great toe ext to be able to provide reasonable increase in mobility needed to improve gait mechanics and reduce pain Baseline: 01/24/23 32d Goal status: INITIAL  4.  Pt will demonstrate SLS time of 45sec bilat in order to demonstrate age matched static balance of improvement on ankle stability needed for ankle strategy Baseline: 01/24/23 R 1sec L 13sec Goal status: INITIAL  5.  Pt will demonstrate at least 14 R SL bridges to failure in order to demonstrate 90% hip ext strength of unaffected side to complete heavy ADLs without compensation Baseline: 01/24/23 R 13 L 17 Goal status: INITIAL  6.  Pt will demonstrate at least 18 SL heel raises on RLE (in available range) to demonstrate 90% endurance of unaffected side in order to ambulate in community without fatigue or compensation Baseline: R: unable L 21reps Goal status: INITIAL   PLAN:  PT FREQUENCY: 1-2x/week  PT DURATION: 8 weeks  PLANNED INTERVENTIONS: Therapeutic exercises, Therapeutic activity, Neuromuscular re-education, Balance training, Gait training, Patient/Family education, Self Care, Joint mobilization, Joint  manipulation, Stair training, DME instructions, Dry Needling, Electrical stimulation, Spinal manipulation, Spinal mobilization, Cryotherapy, Moist heat, Traction, Manual therapy, and Re-evaluation  PLAN FOR NEXT SESSION:  HEP review, proprioception exercises.   Nolon Bussing, PT, DPT Physical Therapist - [facility]  02/14/23, 2:54 PM

## 2023-02-16 ENCOUNTER — Encounter: Payer: BC Managed Care – PPO | Admitting: Physical Therapy

## 2023-02-16 ENCOUNTER — Ambulatory Visit: Payer: 59

## 2023-02-21 ENCOUNTER — Ambulatory Visit: Payer: 59

## 2023-02-23 ENCOUNTER — Ambulatory Visit: Payer: 59 | Attending: Podiatry

## 2023-03-01 ENCOUNTER — Encounter: Payer: 59 | Admitting: Family Medicine

## 2023-06-05 IMAGING — MR MR ANKLE*R* W/O CM
4 of 5 series · 13 of 40 positions shown · non-contrast
Comparison: Plain films right foot and ankle 12/10/2021.

CLINICAL DATA: Chronic right foot and ankle pain. Heel pain.
Question osteoarthritis.

EXAM:
MRI OF THE RIGHT ANKLE WITHOUT CONTRAST
TECHNIQUE: Multiplanar, multisequence MR imaging of the ankle was performed. No
intravenous contrast was administered.

[Series 12: PD fat-sat · axial · right · 3.0mm · 0.25mm/px · z∈[-41,+75]mm · 4 of 35 slices shown]
[im 1/35]
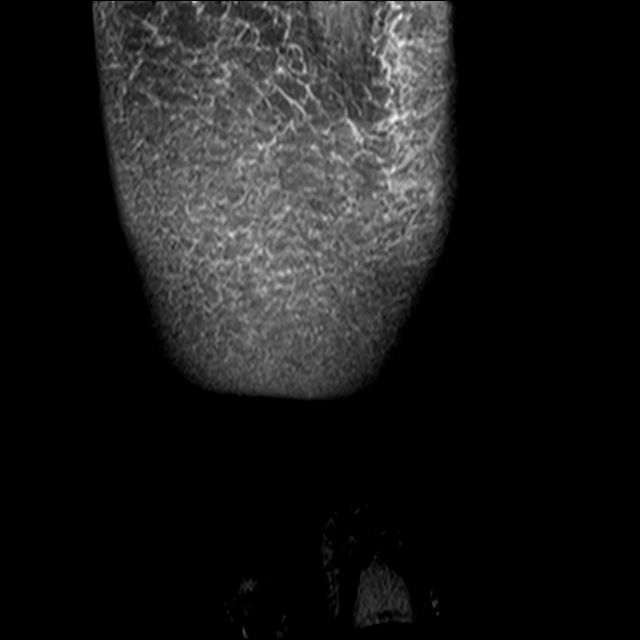
[im 5/35]
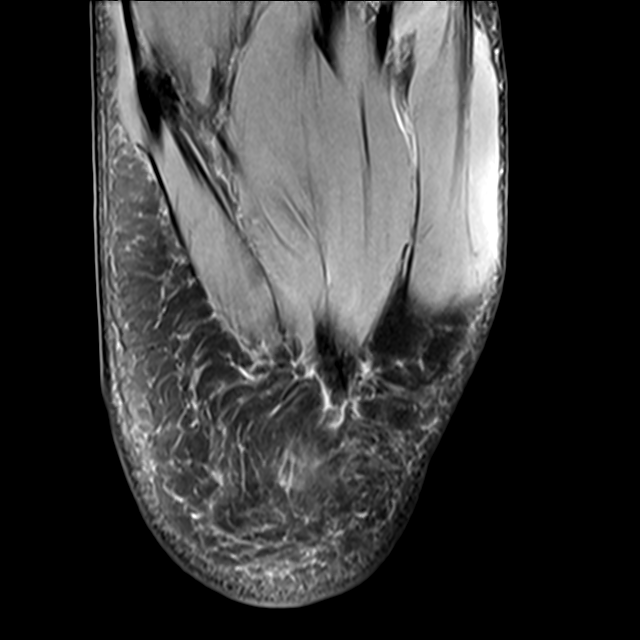
[im 18/35]
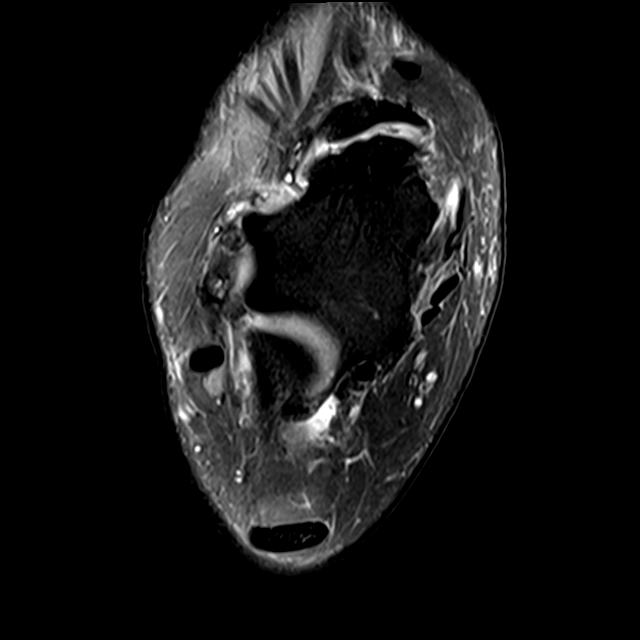
[im 30/35]
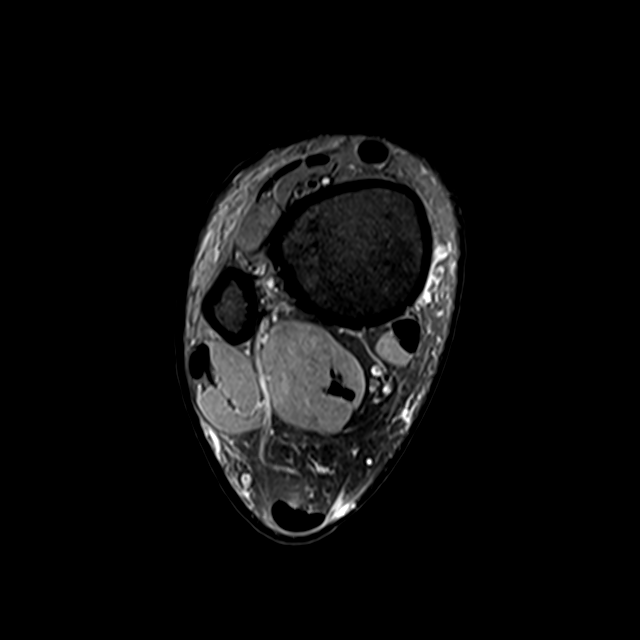

[Series 13: T2 fat-sat · axial · right · 3.0mm · 0.25mm/px · z∈[-25,+75]mm · 3 of 35 slices shown (1 of 2)]
[im 5/35]
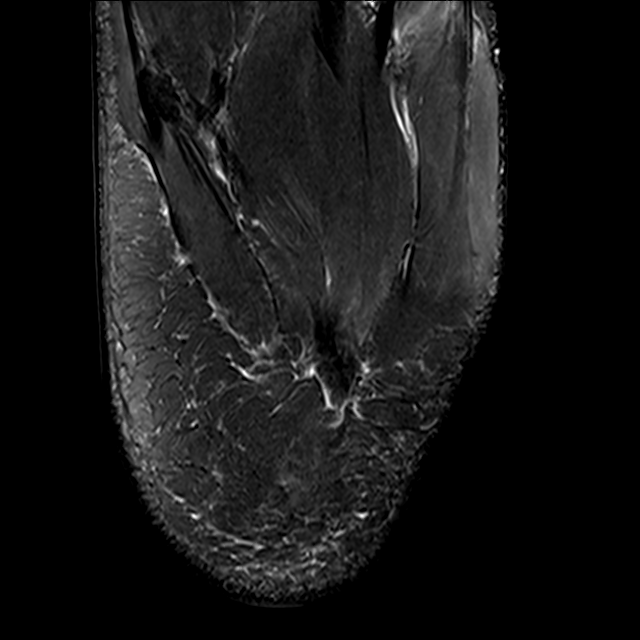
[im 18/35]
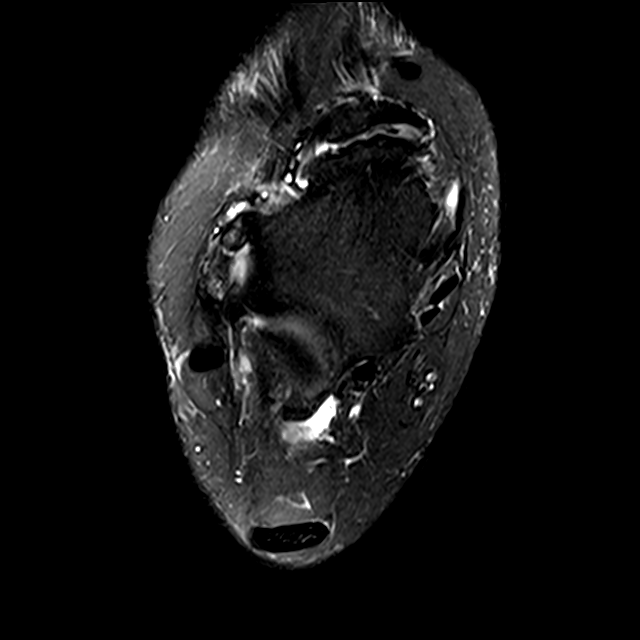
[im 30/35]
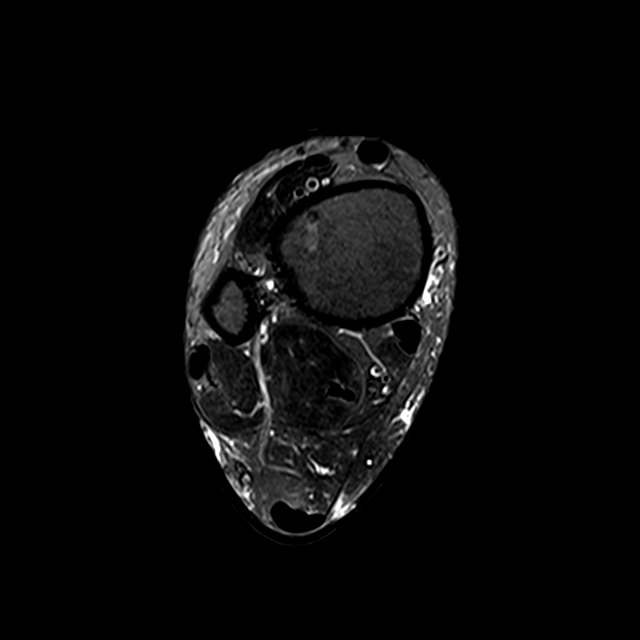

[Series 14: T1 · sagittal · right · 4.0mm · 0.27mm/px · 3 of 24 slices shown]
[im 5/24]
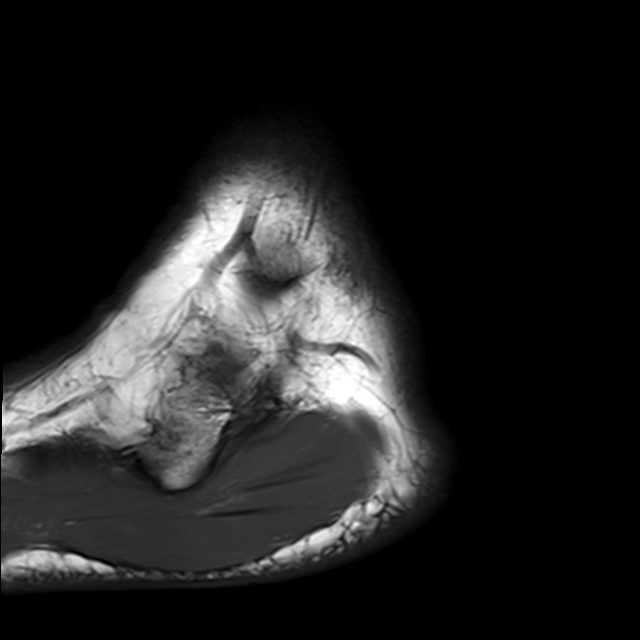
[im 14/24]
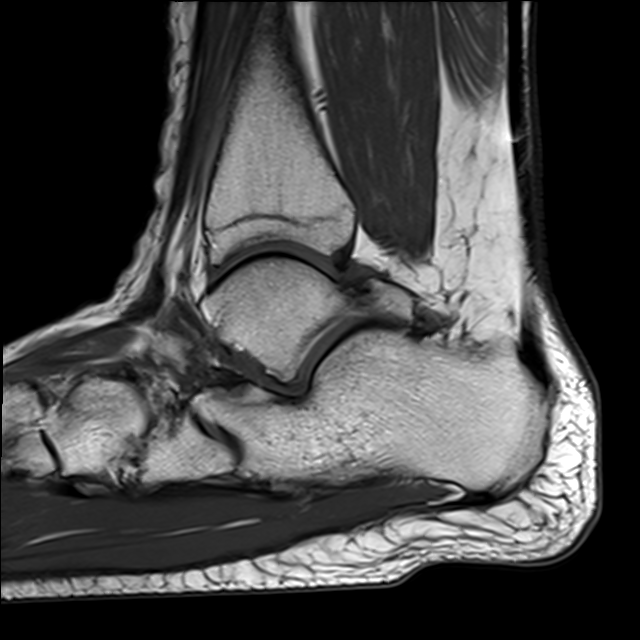
[im 24/24]
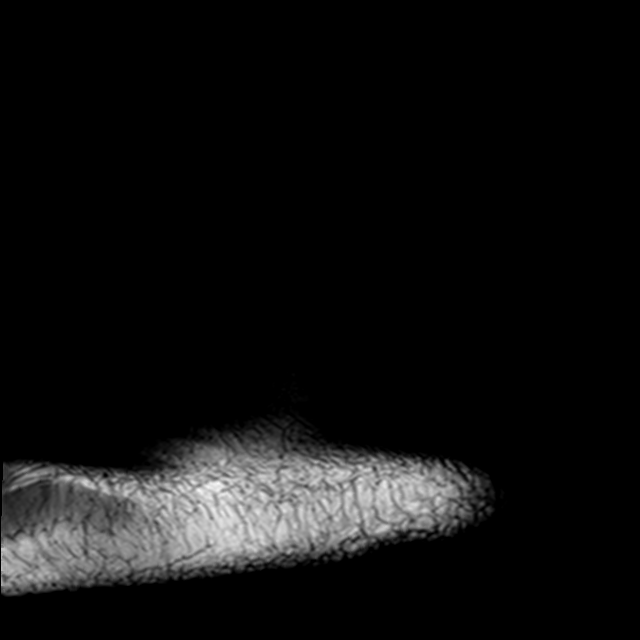

[Series 17: T2 fat-sat · coronal · right · 3.0mm · 0.25mm/px · 3 of 38 slices shown (2 of 2)]
[im 5/38]
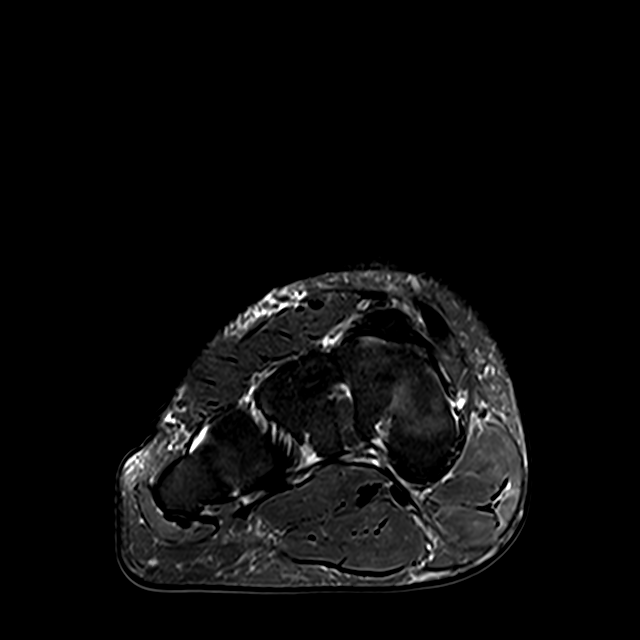
[im 21/38]
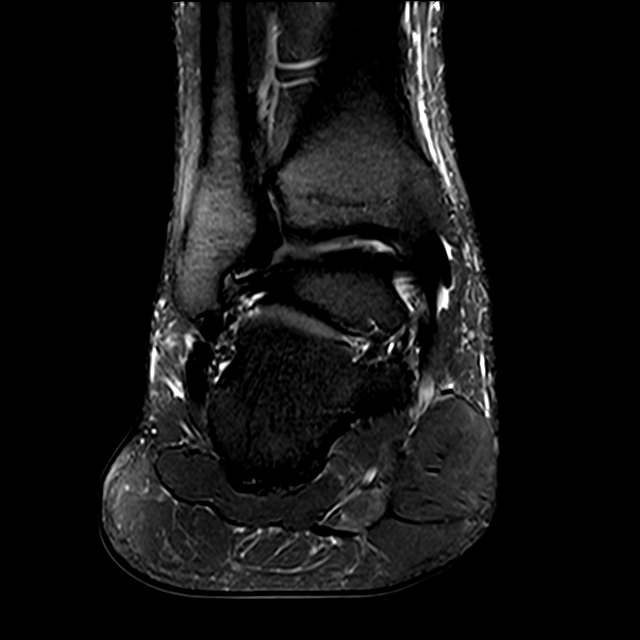
[im 33/38]
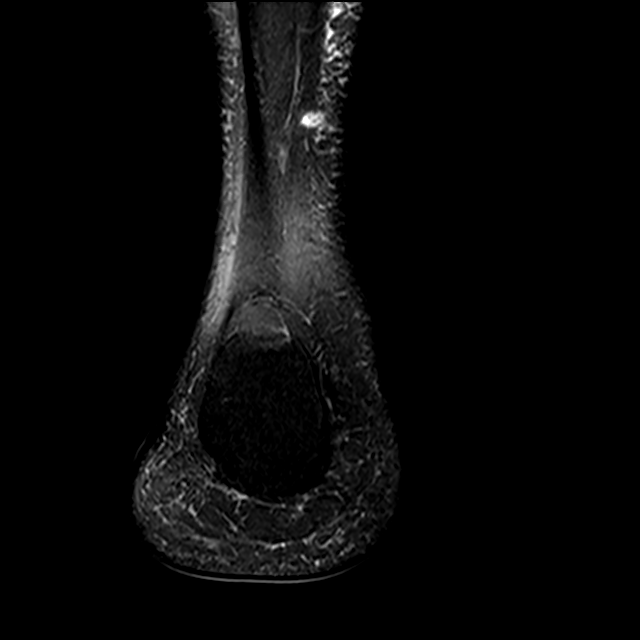

[13 of 40 positions shown; findings below may reference images not displayed]

FINDINGS: TENDONS

Peroneal: Intact.

Posteromedial: Intact.

Anterior: Intact.

Achilles: Intact. Small focus of mildly increased T2 signal is seen
in the central fibers of the distal tendon compatible with
tendinosis.

Plantar Fascia: Intact.  Negative for plantar fasciitis.

LIGAMENTS

Lateral: The anterior talofibular ligament is torn. Small ossific
fragments about the site of the tear consistent with a chronic
injury. Lateral ligaments are otherwise intact.

Medial: Intact.

CARTILAGE

Ankle Joint: Negative. No osteochondral lesion of the talar dome or
joint effusion.

Subtalar Joints/Sinus Tarsi: Negative.

Bones: Pes planus deformity is seen. Joint space narrowing and
osteophytosis are present at the talonavicular joint. There is
lateral angulation of the calcaneus with a hindfoot valgus angle of
33 degrees. A tiny focus of edema is seen in the lateral process of
talus. Marrow signal in the distal fibula and the adjacent calcaneus
is normal.

Other: None.
IMPRESSION: Chronic ATFL tear.

Pes planus and hindfoot valgus deformity.

Talonavicular osteoarthritis.

Very mild Achilles tendinosis without tear.

## 2023-06-05 IMAGING — MR MR FOOT*R* W/O CM
6 series · 40 of 40 positions shown · non-contrast
Comparison: X-ray 12/10/2021

CLINICAL DATA: Foot pain, chronic, osteoarthritis suspected

EXAM:
MRI OF THE RIGHT FOREFOOT WITHOUT CONTRAST
TECHNIQUE: Multiplanar, multisequence MR imaging of the right forefoot was
performed. No intravenous contrast was administered.

[Series 4: T1 · coronal · right · 3.5mm · 0.36mm/px · 8 of 48 slices shown (1 of 2)]
[im 1/48]
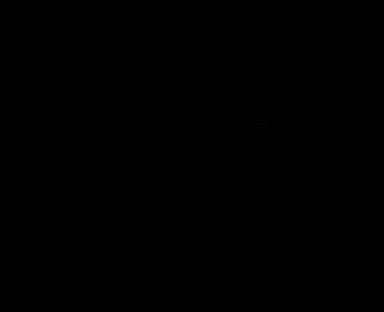
[im 7/48]
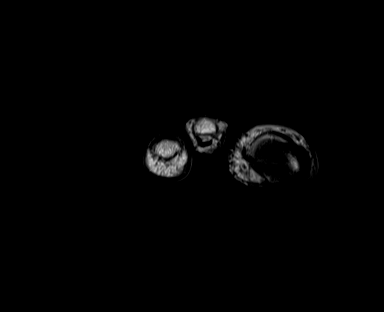
[im 14/48]
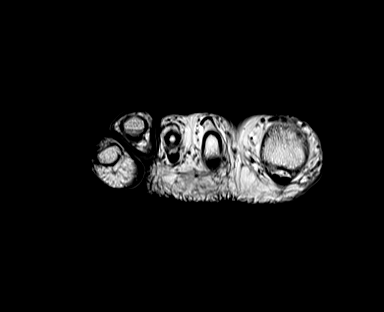
[im 21/48]
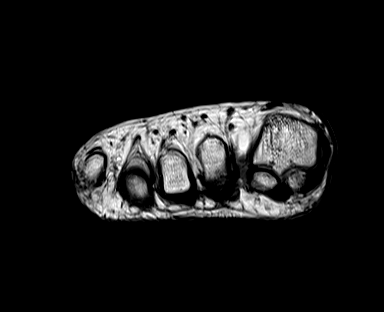
[im 27/48]
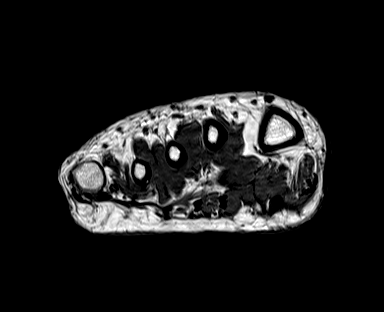
[im 34/48]
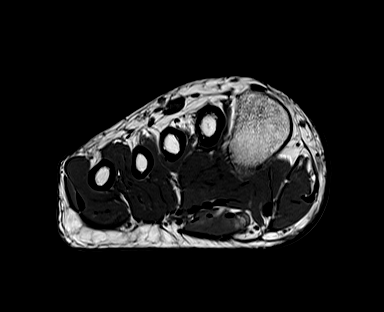
[im 41/48]
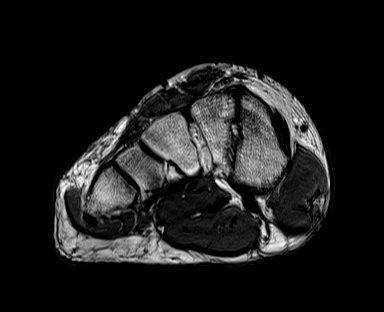
[im 48/48]
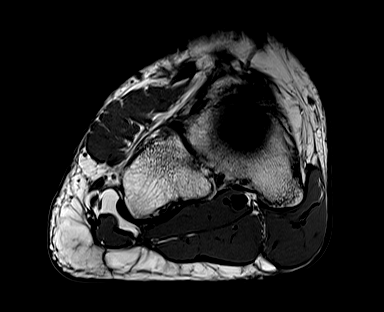

[Series 5: T2 fat-sat · coronal · right · 3.5mm · 0.36mm/px · 9 of 48 slices shown (1 of 3)]
[im 1/48]
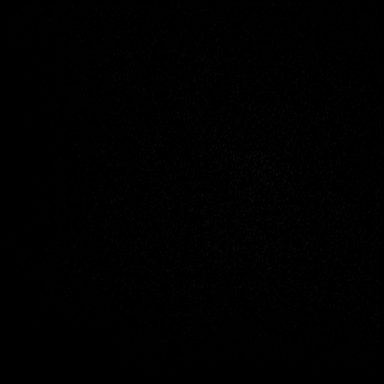
[im 6/48]
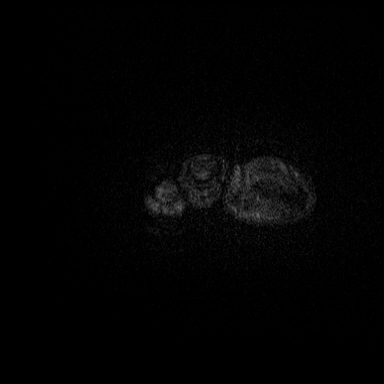
[im 12/48]
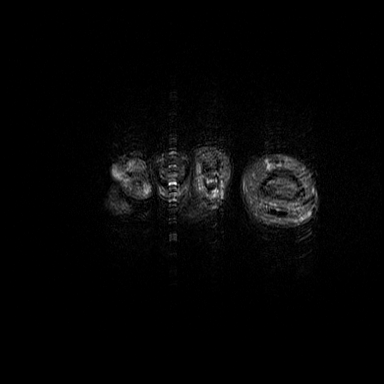
[im 18/48]
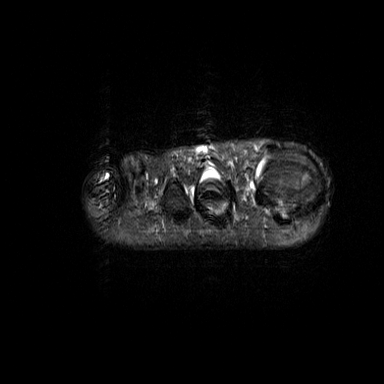
[im 24/48]
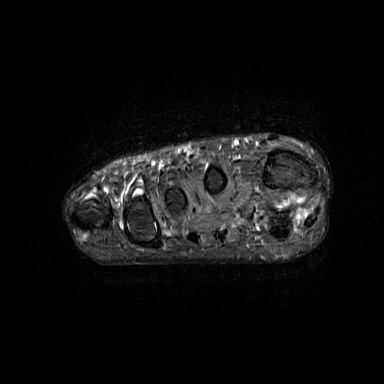
[im 30/48]
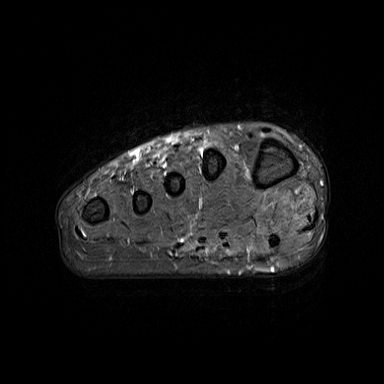
[im 36/48]
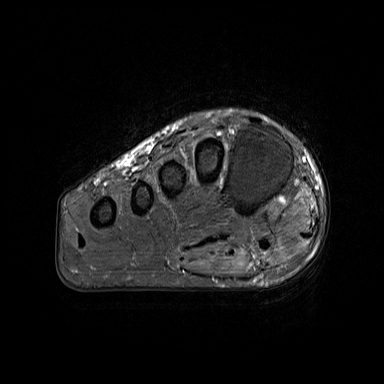
[im 42/48]
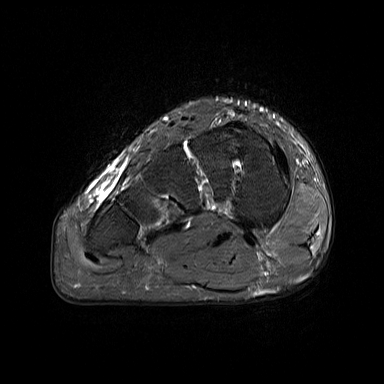
[im 48/48]
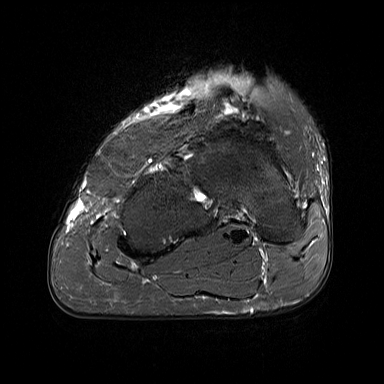

[Series 6: STIR · sagittal · right · 3.0mm · 0.78mm/px · 6 of 33 slices shown]
[im 1/33]
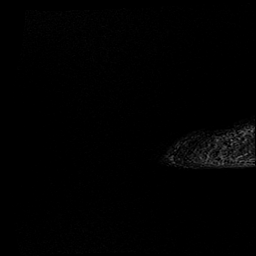
[im 7/33]
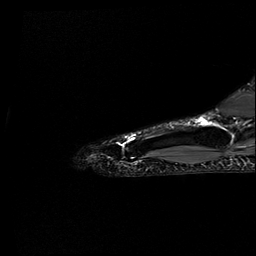
[im 13/33]
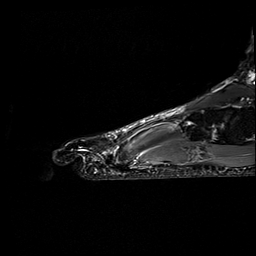
[im 20/33]
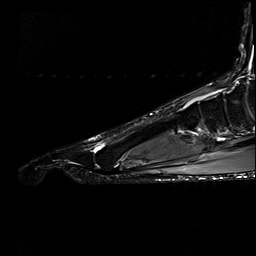
[im 26/33]
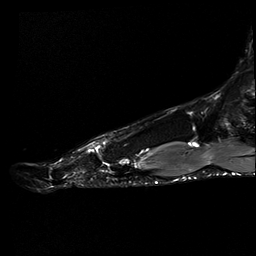
[im 33/33]
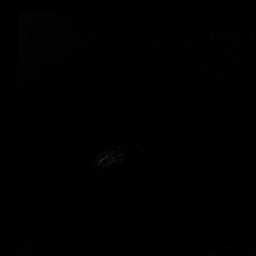

[Series 7: T1 · axial · right · 3.0mm · 0.52mm/px · z∈[-75,+1]mm · 4 of 21 slices shown (2 of 2)]
[im 1/21]
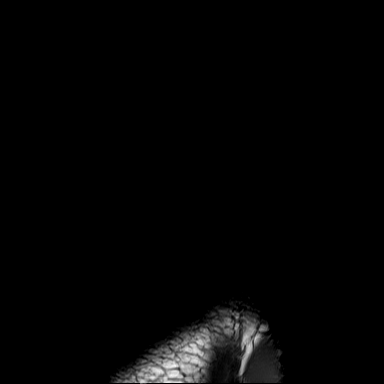
[im 7/21]
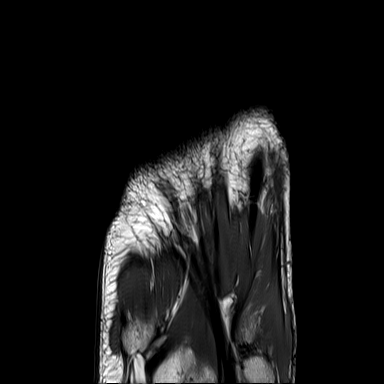
[im 14/21]
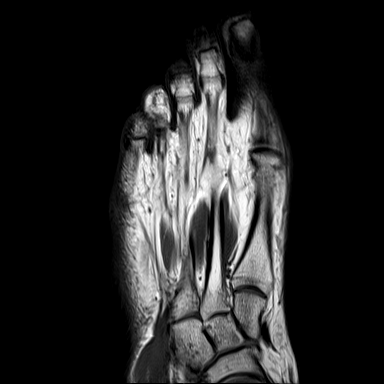
[im 21/21]
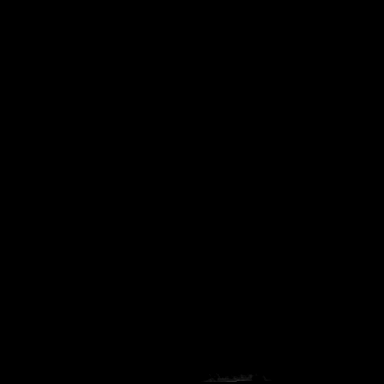

[Series 8: T2 fat-sat · axial · right · 3.0mm · 0.52mm/px · z∈[-75,+1]mm · 4 of 21 slices shown (2 of 3)]
[im 1/21]
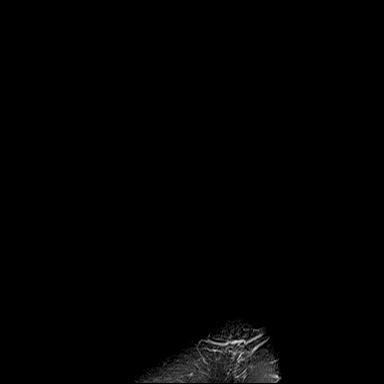
[im 7/21]
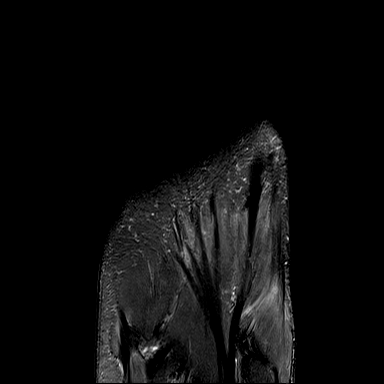
[im 14/21]
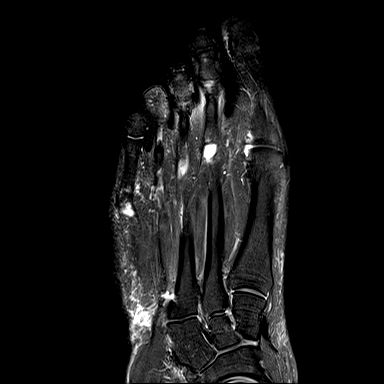
[im 21/21]
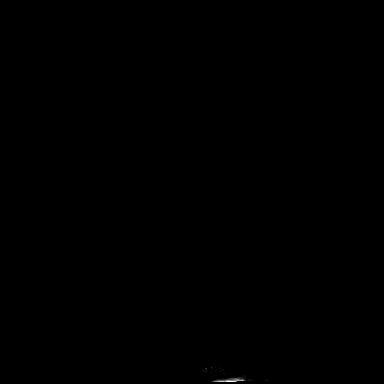

[Series 9: T2 fat-sat · coronal · right · 3.5mm · 0.44mm/px · 9 of 48 slices shown (3 of 3)]
[im 1/48]
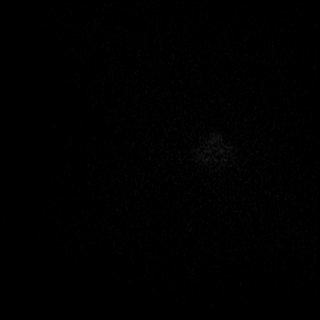
[im 6/48]
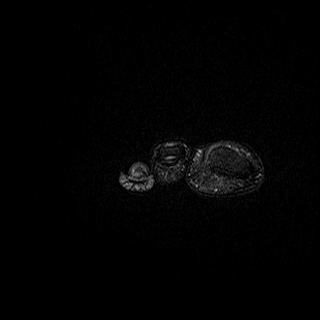
[im 12/48]
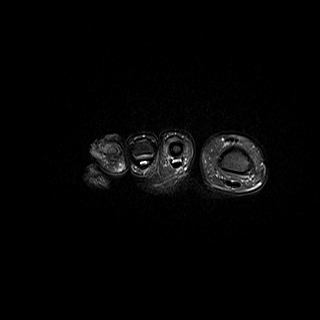
[im 18/48]
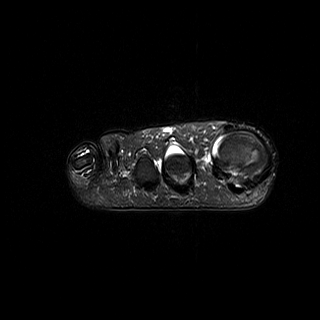
[im 24/48]
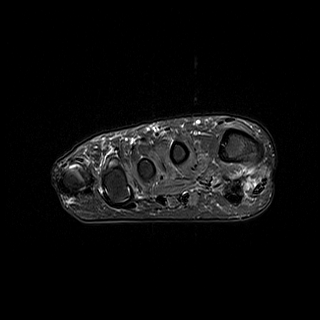
[im 30/48]
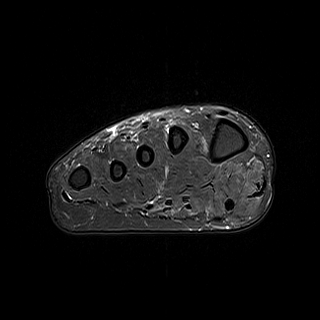
[im 36/48]
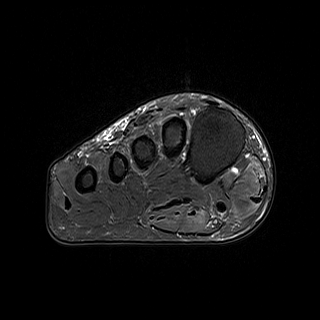
[im 42/48]
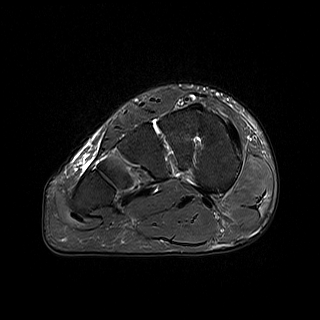
[im 48/48]
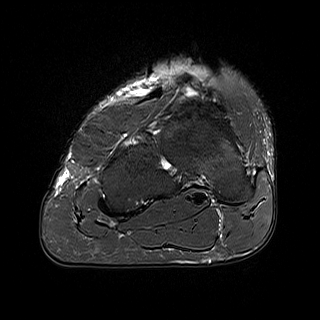

[40 of 40 positions shown; findings below may reference images not displayed]

FINDINGS: Bones/Joint/Cartilage

No acute fracture. No dislocation. No bone marrow edema or
periostitis. Mild osteoarthritis of the first MTP joint with
chondral thinning and small marginal osteophyte formation.
Talonavicular joint osteoarthritis is partially visualized at the
edge of the field of view. No erosion or joint effusion. No
suspicious bone lesion.

Ligaments

Intact Lisfranc ligament. Collateral ligaments of the forefoot are
intact.

Muscles and Tendons

Intact flexor and extensor tendons. Mild intramuscular edema within
the abductor hallucis, flexor hallucis brevis, and flexor digitorum
brevis muscles within the forefoot. No intramuscular fluid
collection. Normal muscle bulk without atrophy or fatty
infiltration.

Soft tissues

Small amount of fluid within the first, second, and third
intermetatarsal spaces. Minimal subcutaneous edema at the dorsum of
the forefoot. No soft tissue mass.
IMPRESSION: 1. No acute osseous abnormality of the right forefoot.
2. Mild intramuscular edema within the abductor hallucis, flexor
hallucis brevis, and flexor digitorum brevis muscles within the
forefoot. Findings are nonspecific and could reflect mild muscle
strains.
3. Mild osteoarthritis of the first MTP joint.
4. Small amount of fluid within the first, second, and third
intermetatarsal spaces, which may reflect mild bursitis.

## 2023-12-05 ENCOUNTER — Ambulatory Visit: Payer: Self-pay

## 2023-12-07 ENCOUNTER — Encounter: Payer: Self-pay | Admitting: Family Medicine

## 2023-12-07 ENCOUNTER — Ambulatory Visit: Payer: Self-pay | Admitting: Family Medicine

## 2023-12-07 DIAGNOSIS — Z113 Encounter for screening for infections with a predominantly sexual mode of transmission: Secondary | ICD-10-CM

## 2023-12-07 LAB — HM HIV SCREENING LAB: HM HIV Screening: NEGATIVE

## 2023-12-07 LAB — GRAM STAIN

## 2023-12-07 NOTE — Progress Notes (Signed)
Pt is here for STD testing.  Gram stain results reviewed, no treatment required per SO.  Condoms given.  Berdie Ogren, RN

## 2023-12-07 NOTE — Progress Notes (Signed)
[facility] Department STI clinic [address], [address] [place] [address] Main phone: [phone]  STI screening visit  Subjective:  Frederick Delgado is a 33 y.o. male being seen today for an STI screening visit. The patient reports they do have symptoms.    Patient has the following medical conditions:  Patient Active Problem List   Diagnosis Date Noted   Morbid obesity with BMI of 45.0-49.9, adult (HCC) 01/25/2022   Annual physical exam 01/25/2022   Flat foot 11/26/2021   Localized osteoarthritis of right ankle 11/26/2021   Achilles tendinitis, right leg 11/26/2021   Posterior tibial tendinitis, right 11/26/2021    Chief Complaint  Patient presents with   SEXUALLY TRANSMITTED DISEASE    STD testing.  "Uncomfortable at the tip of my penis"    HPI HPI Patient reports to clinic with c/o discomfort at head of penis for a few weeks. Pt reports he has a spot on his penis- states it has been there since before he was sexually active  STI screening history: Last HIV test per patient/review of record was  Lab Results  Component Value Date   HMHIVSCREEN Negative - Validated 09/20/2017   No results found for: "HIV"  Last HEPC test per patient/review of record was No results found for: "HMHEPCSCREEN" No components found for: "HEPC"   Last HEPB test per patient/review of record was No components found for: "HMHEPBSCREEN"   Fertility: Does the patient or their partner desires a pregnancy in the next year? No  Screening for MPX risk: Does the patient have an unexplained rash? No Is the patient MSM? No Does the patient endorse multiple sex partners or anonymous sex partners? No Did the patient have close or sexual contact with a person diagnosed with MPX? No Has the patient traveled outside the [place] where MPX is endemic? No Is there a high clinical suspicion for MPX-- evidenced by one of the following No  -Unlikely to be  chickenpox  -Lymphadenopathy  -Rash that present in same phase of evolution on any given body part   See flowsheet for further details and programmatic requirements.    There is no immunization history on file for this patient.   The following portions of the patient's history were reviewed and updated as appropriate: allergies, current medications, past medical history, past social history, past surgical history and problem list.  Objective:  There were no vitals filed for this visit.  Physical Exam Exam conducted with a chaperone present Vinnie Langton Oppong RN).  Constitutional:      Appearance: He is obese.  HENT:     Head: Normocephalic and atraumatic.     Comments: No nits or hair loss    Mouth/Throat:     Mouth: Mucous membranes are moist. No oral lesions.     Pharynx: Oropharynx is clear. No oropharyngeal exudate or posterior oropharyngeal erythema.  Eyes:     General:        Right eye: No discharge.        Left eye: No discharge.     Conjunctiva/sclera:     Right eye: Right conjunctiva is not injected. No exudate.    Left eye: Left conjunctiva is not injected. No exudate. Pulmonary:     Effort: Pulmonary effort is normal.  Abdominal:     General: Abdomen is flat.     Palpations: Abdomen is soft. There is no hepatomegaly or mass.     Tenderness: There is no abdominal tenderness. There is no  rebound.     Hernia: There is no hernia in the left inguinal area or right inguinal area.  Genitourinary:    Pubic Area: No rash or pubic lice (no nits).      Penis: Lesions present. No tenderness, discharge or swelling.      Testes: Normal.     Epididymis:     Right: Normal. No mass or tenderness.     Left: Normal. No mass or tenderness.     Rectum: Normal. No tenderness (no lesions or discharge).       Comments: Penile Discharge Amount: none Color:  none  Small cauliflower like lesion on left shaft of penis Lymphadenopathy:     Head:     Right side of head: No  preauricular or posterior auricular adenopathy.     Left side of head: No preauricular or posterior auricular adenopathy.     Cervical: No cervical adenopathy.     Upper Body:     Right upper body: No supraclavicular, axillary or epitrochlear adenopathy.     Left upper body: No supraclavicular, axillary or epitrochlear adenopathy.     Lower Body: No right inguinal adenopathy. No left inguinal adenopathy.  Skin:    General: Skin is warm and dry.     Findings: No lesion or rash.  Neurological:     Mental Status: He is alert and oriented to person, place, and time.     Assessment and Plan:  Frederick Delgado is a 33 y.o. male presenting to the [facility] Department for STI screening  1. Screening for venereal disease (Primary) -wart like lesion on left shaft- patient states this has been there since prior to sexual activity  - Gram stain - Chlamydia/GC NAA, Confirmation - HIV [place] [facility] - Syphilis Serology, [facility]    Patient does have STI symptoms Patient accepted all screenings including  urine GC/Chlamydia, and blood work for HIV/Syphilis. Patient meets criteria for HepB screening? No. Ordered? not applicable Patient meets criteria for HepC screening? No. Ordered? not applicable Recommended condom use with all sex Discussed importance of condom use for STI prevention  Treat positive test results per standing order. Discussed time line for [facility] results and that patient will be called with positive results and encouraged patient to call if he had not heard in 2 weeks Recommended repeat testing in 3 months with positive results. Recommended returning for continued or worsening symptoms.   Return if symptoms worsen or fail to improve, for STI screening.  No future appointments.  Lenice Llamas, [place]

## 2023-12-11 LAB — CHLAMYDIA/GC NAA, CONFIRMATION
Chlamydia trachomatis, NAA: NEGATIVE
Neisseria gonorrhoeae, NAA: NEGATIVE
